# Patient Record
Sex: Female | Born: 1985
Health system: Southern US, Community
[De-identification: ages and names within clinical notes are randomized; demographics above are authoritative.]

## PROBLEM LIST (undated history)

## (undated) DIAGNOSIS — K5900 Constipation, unspecified: Secondary | ICD-10-CM

## (undated) DIAGNOSIS — K5909 Other constipation: Secondary | ICD-10-CM

## (undated) DIAGNOSIS — N946 Dysmenorrhea, unspecified: Secondary | ICD-10-CM

## (undated) DIAGNOSIS — Z973 Presence of spectacles and contact lenses: Secondary | ICD-10-CM

## (undated) DIAGNOSIS — K921 Melena: Secondary | ICD-10-CM

## (undated) HISTORY — DX: Constipation, unspecified: K59.00

## (undated) HISTORY — PX: NO PAST SURGERIES: SHX2092

## (undated) HISTORY — DX: Melena: K92.1

## (undated) HISTORY — PX: WISDOM TOOTH EXTRACTION: SHX21

---

## 2015-07-09 ENCOUNTER — Ambulatory Visit (INDEPENDENT_AMBULATORY_CARE_PROVIDER_SITE_OTHER): Payer: BLUE CROSS/BLUE SHIELD | Admitting: Obstetrics and Gynecology

## 2015-07-09 ENCOUNTER — Encounter: Payer: Self-pay | Admitting: Obstetrics and Gynecology

## 2015-07-09 VITALS — BP 98/58 | HR 68 | Resp 14 | Ht 62.0 in | Wt 112.0 lb

## 2015-07-09 DIAGNOSIS — Z3009 Encounter for other general counseling and advice on contraception: Secondary | ICD-10-CM

## 2015-07-09 DIAGNOSIS — Z124 Encounter for screening for malignant neoplasm of cervix: Secondary | ICD-10-CM | POA: Diagnosis not present

## 2015-07-09 DIAGNOSIS — N63 Unspecified lump in breast: Secondary | ICD-10-CM

## 2015-07-09 DIAGNOSIS — Z01419 Encounter for gynecological examination (general) (routine) without abnormal findings: Secondary | ICD-10-CM

## 2015-07-09 DIAGNOSIS — L293 Anogenital pruritus, unspecified: Secondary | ICD-10-CM | POA: Diagnosis not present

## 2015-07-09 DIAGNOSIS — N6312 Unspecified lump in the right breast, upper inner quadrant: Secondary | ICD-10-CM

## 2015-07-09 MED ORDER — NORGESTIMATE-ETH ESTRADIOL 0.25-35 MG-MCG PO TABS: 1.0000 | ORAL_TABLET | Freq: Every day | ORAL | Status: DC

## 2015-07-09 MED ORDER — BETAMETHASONE VALERATE 0.1 % EX OINT: TOPICAL_OINTMENT | CUTANEOUS | Status: DC

## 2015-07-09 NOTE — Progress Notes (Signed)
Patient is scheduled for Bilateral Breast Diagnostic Mammogram and R Breast Ultrasound at [facility] on 07/17/15 at 1450 . Patient agreeable to time/date/location. She is given CPT codes to call and request information regarding coverage of diagnostic imaging.

## 2015-07-09 NOTE — Progress Notes (Signed)
Patient ID: Mariah Brennan, female   DOB: 1986/07/12, 29 y.o.   MRN: [id number] 29 y.o. [id number] MarriedAfrican AmericanF here for annual exam.  On OCP's, normal cycles, can saturate a regular tampon in up to 2 hours. No BTB, cramps x 1 day, tolerable. Ibuprofen helps. Married, no dyspareunia. No plans for pregnancy in the next year. She c/o intermittent vulvar irritation, wondering if she can use something topically. Denies increased vaginal d/c.  Period Cycle (Days): 28 Period Duration (Days): 5 days  Period Pattern: Regular Menstrual Flow: Moderate Menstrual Control: Tampon, Maxi pad, Thin pad Dysmenorrhea Symptoms: Cramping  Patient's last menstrual period was 06/25/2015.          Sexually active: Yes.    The current method of family planning is OCP (estrogen/progesterone).    Exercising: No.  The patient does not participate in regular exercise at present. Smoker:  no  Health Maintenance: Pap:  2015 WNL per patient History of abnormal Pap:  no TDaP:  Unsure, thinks UTD she will check. Gardasil: no    reports that she has never smoked. She has never used smokeless tobacco. 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   History reviewed. No pertinent past medical history.  Past Surgical History  Procedure Laterality Date  . Wisdom tooth extraction      Current Outpatient Prescriptions  Medication Sig Dispense Refill  . betamethasone valerate ointment (VALISONE) 0.1 % Apply a pea sized amount BID x 1-2 weeks as needed 30 g 0  . norgestimate-ethinyl estradiol (SPRINTEC 28) 0.25-35 MG-MCG tablet Take 1 tablet by mouth daily. 3 Package 3   No current facility-administered medications for this visit.    Family History  Problem Relation Age of Onset  . Stroke Maternal Grandfather     Review of Systems  Constitutional: Negative.   HENT: Negative.   Eyes: Negative.   Respiratory: Negative.   Cardiovascular:  Negative.   Gastrointestinal: Negative.   Endocrine: Negative.   Genitourinary: Negative.   Musculoskeletal: Negative.   Skin: Negative.   Allergic/Immunologic: Negative.   Neurological: Negative.   Psychiatric/Behavioral: Negative.     Exam:   BP 98/58 mmHg  Pulse 68  Resp 14  Ht 5\' 2"  (1.575 m)  Wt 112 lb (50.803 kg)  BMI 20.48 kg/m2  LMP 06/25/2015  Weight change: @WEIGHTCHANGE @ Height:   Height: 5\' 2"  (157.5 cm)  Ht Readings from Last 3 Encounters:  07/09/15 5\' 2"  (1.575 m)    General appearance: alert, cooperative and appears stated age Head: Normocephalic, without obvious abnormality, atraumatic Neck: no adenopathy, supple, symmetrical, trachea midline and thyroid normal to inspection and palpation Lungs: clear to auscultation bilaterally Breasts: In the right breast at 2 o'clock, just outside the areolar region is an approximately 1 cm mobile lump, minimally tender. Left breast without lumps. Bilaterally no skin dimpling or skin changes.  Heart: regular rate and rhythm Abdomen: soft, non-tender; bowel sounds normal; no masses,  no organomegaly Extremities: extremities normal, atraumatic, no cyanosis or edema Skin: Skin color, texture, turgor normal. No rashes or lesions Lymph nodes: Cervical, supraclavicular, and axillary nodes normal. No abnormal inguinal nodes palpated Neurologic: Grossly normal   Pelvic: External genitalia:  The posterior fourchette is slightly erythematous/white with a few small fissures              Urethra:  normal appearing urethra with no masses, tenderness or lesions  Bartholins and Skenes: normal                 Vagina: normal appearing vagina with an increase of thick, creamy, white vaginal d/c.              Cervix: no lesions               Bimanual Exam:  Uterus:  normal size, contour, position, consistency, mobility, non-tender              Adnexa: no mass, fullness, tenderness               Rectovaginal: Confirms                Anus:  normal sphincter tone, no lesions  Chaperone was present for exam.  A:  Well Woman with normal exam  Breast lump, right breast at 2 o'clock  Contraception  Screening for cervical cancer  Genital pruritus, skin changes cw lichen simplex chronicus  P:   Pap with reflex hpv  Diagnostic breast imaging  Discussed TDAP  Discussed cholesterol screening  Breast self exams discussed  Continue OCP's  Wet prep probe  Treat vulva with steroid ointment x 1-2 weeks, then switch to Vaseline to use prn

## 2015-07-09 NOTE — Patient Instructions (Signed)

## 2015-07-10 LAB — IPS PAP TEST WITH REFLEX TO HPV

## 2015-07-10 LAB — WET PREP BY MOLECULAR PROBE
CANDIDA SPECIES: NEGATIVE
GARDNERELLA VAGINALIS: NEGATIVE
TRICHOMONAS VAG: NEGATIVE

## 2015-07-17 ENCOUNTER — Ambulatory Visit
Admission: RE | Admit: 2015-07-17 | Discharge: 2015-07-17 | Disposition: A | Discharge status: Home / Self Care | Payer: BLUE CROSS/BLUE SHIELD | Source: Ambulatory Visit | Attending: Obstetrics and Gynecology | Admitting: Obstetrics and Gynecology

## 2015-07-17 DIAGNOSIS — N6312 Unspecified lump in the right breast, upper inner quadrant: Secondary | ICD-10-CM

## 2015-07-24 ENCOUNTER — Telehealth: Payer: Self-pay | Admitting: Emergency Medicine

## 2015-07-24 NOTE — Telephone Encounter (Signed)
Call to patient and message from Dr. Talbert Nan given. Patient verbalized understanding and will follow up at appointment.  Routing to provider for final review. Patient agreeable to disposition. Will close encounter.

## 2015-07-24 NOTE — Telephone Encounter (Signed)
-----   Message from Salvadore Dom, MD sent at 07/17/2015  4:56 PM EST ----- The patient should do monthly breast self exams and f/u here for a breast exam in 1/17 (scheduled)

## 2015-08-27 ENCOUNTER — Ambulatory Visit: Payer: BLUE CROSS/BLUE SHIELD | Admitting: Obstetrics and Gynecology

## 2015-08-30 ENCOUNTER — Ambulatory Visit (INDEPENDENT_AMBULATORY_CARE_PROVIDER_SITE_OTHER): Payer: BLUE CROSS/BLUE SHIELD | Admitting: Obstetrics and Gynecology

## 2015-08-30 ENCOUNTER — Encounter: Payer: Self-pay | Admitting: Obstetrics and Gynecology

## 2015-08-30 VITALS — BP 96/56 | HR 80 | Resp 16 | Wt 111.0 lb

## 2015-08-30 DIAGNOSIS — N63 Unspecified lump in unspecified breast: Secondary | ICD-10-CM

## 2015-08-30 DIAGNOSIS — N644 Mastodynia: Secondary | ICD-10-CM | POA: Diagnosis not present

## 2015-08-30 NOTE — Progress Notes (Signed)
Patient ID: Mariah Brennan, female   DOB: [birth year], 30 y.o.   MRN: [id number] GYNECOLOGY  VISIT   HPI: 30 y.o.   Married  [place] American  female   [place] with Patient's last menstrual period was 08/20/2015.   here for right breast recheck. She was noted to have a right breast lump at her annual exam. Negative imaging. She c/o bilateral breast tenderness, no change. She drinks tea. The lump feels unchanged to her.   GYNECOLOGIC HISTORY: Patient's last menstrual period was 08/20/2015. Contraception:OCP Menopausal hormone therapy: None        OB History    Gravida Para Term Preterm AB TAB SAB Ectopic Multiple Living   0 0 0 0 0 0 0 0 0 0          There are no active problems to display for this patient.   History reviewed. No pertinent past medical history.  Past Surgical History  Procedure Laterality Date  . Wisdom tooth extraction      Current Outpatient Prescriptions  Medication Sig Dispense Refill  . norgestimate-ethinyl estradiol (SPRINTEC 28) 0.25-35 MG-MCG tablet Take 1 tablet by mouth daily. 3 Package 3   No current facility-administered medications for this visit.     ALLERGIES: Review of patient's allergies indicates no known allergies.  Family History  Problem Relation Age of Onset  . Stroke Maternal Grandfather     Social History   Social History  . Marital Status: Married    Spouse Name: N/A  . Number of Children: N/A  . Years of Education: N/A   Occupational History  . Not on file.   Social History Main Topics  . Smoking status: Never Smoker   . Smokeless tobacco: Never Used  . Alcohol Use: No  . Drug Use: No  . Sexual Activity:    Partners: Male   Other Topics Concern  . Not on file   Social History Narrative    Review of Systems  Constitutional: Negative.   HENT: Negative.   Eyes: Negative.   Respiratory: Negative.   Cardiovascular: Negative.   Gastrointestinal: Negative.   Genitourinary: Negative.   Musculoskeletal: Negative.    Skin: Negative.   Neurological: Negative.   Endo/Heme/Allergies: Negative.   Psychiatric/Behavioral: Negative.     PHYSICAL EXAMINATION:    BP 96/56 mmHg  Pulse 80  Resp 16  Wt 111 lb (50.349 kg)  LMP 08/20/2015    General appearance: alert, cooperative and appears stated age Breasts: in the right breast between 2-3 o'clock is an approximately 1 cm smooth, mobile lump. Unchanged from her prior exam No axillary or supraclavicular adenopathy   ASSESSMENT Breast lump, stable, negative imaging Mastalgia, bilateral    PLAN The patient will do monthly breast self exams after her cycle Recommended cutting back on caffeine  Call with any concerns or changes   An After Visit Summary was printed and given to the patient.

## 2015-08-30 NOTE — Patient Instructions (Signed)
Fibrocystic Breast Changes Fibrocystic breast changes occur when breast ducts become blocked, causing painful, fluid-filled lumps (cysts) to form in the breast. This is a common condition that is noncancerous (benign). It occurs when women go through hormonal changes during their menstrual cycle. Fibrocystic breast changes can affect one or both breasts. CAUSES  The exact cause of fibrocystic breast changes is not known, but it may be related to the female hormones estrogen and progesterone. Family traits that get passed from parent to child (genetics) may also be a factor in some cases. SIGNS AND SYMPTOMS   Tenderness, mild discomfort, or pain.   Swelling.   Rope-like feeling when touching the breast.   Lumpy breast, one or both sides.   Changes in breast size, especially before (larger) and after (smaller) the menstrual period.   Green or dark brown nipple discharge (not blood).  Symptoms are usually worse before menstrual periods start and get better toward the end of the menstrual period.  DIAGNOSIS  To make a diagnosis, your health care provider will ask you questions and perform a physical exam of your breasts. The health care provider may recommend other tests that can examine inside your breasts, such as:  A breast X-ray (mammogram).   Ultrasonography.  An MRI.  If something more than fibrocystic breast changes is suspected, your health care provider may take a breast tissue sample (breast biopsy) to examine. TREATMENT  Often, treatment is not needed. Your health care provider may recommend over-the-counter pain relievers to help lessen pain or discomfort caused by the fibrocystic breast changes. You may also be asked to change your diet to limit or stop eating foods or drinking beverages that contain caffeine. Foods and beverages that contain caffeine include chocolate, soda, coffee, and tea. Reducing sugar and fat in your diet may also help. Your health care provider  may also recommend:  Fine needle aspiration to remove fluid from a cyst that is causing pain.   Surgery to remove a large, persistent, and tender cyst. HOME CARE INSTRUCTIONS   Examine your breasts after every menstrual period. If you do not have menstrual periods, check your breasts the first day of every month. Feel for changes, such as more tenderness, a new growth, a change in breast size, or a change in a lump that has always been there.   Only take over-the-counter or prescription medicine as directed by your health care provider.   Wear a well-fitted support or sports bra, especially when exercising.   Decrease or avoid caffeine, fat, and sugar in your diet as directed by your health care provider.  SEEK MEDICAL CARE IF:   You have fluid leaking (discharge) from your nipples, especially bloody discharge.   You have new lumps or bumps in the breast.   Your breast or breasts become enlarged, red, and painful.   You have areas of your breast that pucker in.   Your nipples appear flat or indented.    This information is not intended to replace advice given to you by your health care provider. Make sure you discuss any questions you have with your health care provider.   Document Released: 05/21/2006 Document Revised: 04/25/2015 Document Reviewed: 01/23/2013 Elsevier Interactive Patient Education 2016 [employer]. Breast Tenderness Breast tenderness is a common problem for women of all ages. Breast tenderness may cause mild discomfort to severe pain. It has a variety of causes. Your health care provider will find out the likely cause of your breast tenderness by examining your breasts,  asking you about symptoms, and ordering some tests. Breast tenderness usually does not mean you have breast cancer. HOME CARE INSTRUCTIONS  Breast tenderness often can be handled at home. You can try:  Getting fitted for a new bra that provides more support, especially during  exercise.  Wearing a more supportive bra or sports bra while sleeping when your breasts are very tender.  If you have a breast injury, apply ice to the area:  Put ice in a plastic bag.  Place a towel between your skin and the bag.  Leave the ice on for 20 minutes, 2-3 times a day.  If your breasts are too full of milk as a result of breastfeeding, try:  Expressing milk either by hand or with a breast pump.  Applying a warm compress to the breasts for relief.  Taking over-the-counter pain relievers, if approved by your health care provider.  Taking other medicines that your health care provider prescribes. These may include antibiotic medicines or birth control pills. Over the long term, your breast tenderness might be eased if you:  Cut down on caffeine.  Reduce the amount of fat in your diet. Keep a log of the days and times when your breasts are most tender. This will help you and your health care provider find the cause of the tenderness and how to relieve it. Also, learn how to do breast exams at home. This will help you notice if you have an unusual growth or lump that could cause tenderness. SEEK MEDICAL CARE IF:   Any part of your breast is hard, red, and hot to the touch. This could be a sign of infection.  Fluid is coming out of your nipples (and you are not breastfeeding). Especially watch for blood or pus.  You have a fever as well as breast tenderness.  You have a new or painful lump in your breast that remains after your menstrual period ends.  You have tried to take care of the pain at home, but it has not gone away.  Your breast pain is getting worse, or the pain is making it hard to do the things you usually do during your day.   This information is not intended to replace advice given to you by your health care provider. Make sure you discuss any questions you have with your health care provider.   Document Released: 07/17/2008 Document Revised: 04/06/2013  Document Reviewed: 03/03/2013 Elsevier Interactive Patient Education [employer].

## 2015-09-17 ENCOUNTER — Telehealth: Payer: Self-pay | Admitting: Emergency Medicine

## 2015-09-17 NOTE — Telephone Encounter (Signed)
-----   Message from Salvadore Dom, MD sent at 09/14/2015  8:57 AM EST ----- Regarding: RE: Mammogram hold  Yes, thanks ----- Message -----    From: Michele Mcalpine, RN    Sent: 09/13/2015   5:05 PM      To: Salvadore Dom, MD Subject: Mammogram hold                                 Dr. Talbert Nan,  Patient in [facility] hold for R Breast Mass. Follow up breast check with you completed. Okay to remove from hold?

## 2016-05-18 ENCOUNTER — Other Ambulatory Visit: Payer: Self-pay | Admitting: Obstetrics and Gynecology

## 2016-05-19 NOTE — Telephone Encounter (Signed)
Will send 2 packs, she needs an annual exam in 11/17, please schedule

## 2016-05-19 NOTE — Telephone Encounter (Signed)
Medication refill request: Estarylla  Last AEX:  07/09/15 JJ Next AEX: Not scheduled  Last MMG (if hormonal medication request): 07/17/15 U/S R breast for pain, BIRADS1 Refill authorized: 07/09/15 #3 Packages 3R. Please advise. Thank you.

## 2016-05-19 NOTE — Telephone Encounter (Signed)
Left message for patient to call the office to schedule her AEX for more refills.

## 2016-06-25 ENCOUNTER — Telehealth: Payer: Self-pay | Admitting: Obstetrics and Gynecology

## 2016-06-25 NOTE — Telephone Encounter (Signed)
Patient called and states she is experiencing right breast pain.  She says she feels "something" on the inner side of the breast.

## 2016-06-25 NOTE — Telephone Encounter (Signed)
Spoke with patient. Patient states she has been having right breast pain for approximately 2 week. Patient states she initially thought it was related to something she was wearing. Patient reports pain has not gotten worse or better. Patient reports taking motrin x1 with little relief. Patient states she is unable to feel anything but reports her husband could. Reports redness, irritation, denies any drainage. Recommended OV for further evaluation. Patient scheduled with Dr. Talbert Nan 06/26/16 at 11:30am. Patient agreeable to date and time.   Routing to provider for final review. Patient is agreeable to disposition. Will close encounter.

## 2016-06-26 ENCOUNTER — Encounter: Payer: Self-pay | Admitting: Obstetrics and Gynecology

## 2016-06-26 ENCOUNTER — Ambulatory Visit (INDEPENDENT_AMBULATORY_CARE_PROVIDER_SITE_OTHER): Payer: BLUE CROSS/BLUE SHIELD | Admitting: Obstetrics and Gynecology

## 2016-06-26 VITALS — BP 100/60 | HR 68 | Resp 16 | Ht 62.0 in | Wt 115.0 lb

## 2016-06-26 DIAGNOSIS — N6019 Diffuse cystic mastopathy of unspecified breast: Secondary | ICD-10-CM

## 2016-06-26 DIAGNOSIS — N63 Unspecified lump in unspecified breast: Secondary | ICD-10-CM | POA: Diagnosis not present

## 2016-06-26 DIAGNOSIS — N644 Mastodynia: Secondary | ICD-10-CM

## 2016-06-26 NOTE — Patient Instructions (Signed)
PATIENT INSTRUCTIONS FIBROCYSTIC BREAST DISEASE  FOLLOW-UP:  Please make an appointment with your physician in one month.  Call your physician should you develop a new breast mass that is different, if one particular lump starts to enlarge, or if nipple discharge develops.   CAUSE:  Many women have some lumpiness within their breasts and these areas at times can become tender during certain times in your menstrual cycle.  These areas tend to feel like a firm rubber ball as compared to a cancer which will more commonly feel hard and almost rock-like.  Fibrocystic breast disease does not in and of itself increase your risk for breast cancer but you should be sure to examine yiour breasts at the same time of the month on a monthly basis.  If there are a lot of areas of lumpiness you should tape a piece of paper on the mirror with a diagram of your breasts, noting where the areas of lumpiness are and their relative size.  You can then refer to this diagram on a monthly basis to keep better track of any changes should they occur.  DIET:  You should try and avoid foods, or at least minimize foods, such as chocolate and caffeine which may cause the symptoms of tenderness to become worse.  ACTIVITY:  You may want to wear a bra that offers additional support, and/or consider a sports bra, especially during those times when your breasts are more tender.  MEDICATIONS:  Taking Vitamin E capsules twice a day along with Evening of Primrose Capsules three times a day, or as directed on the bottle, may help your symptoms.  These are both available over-the-counter and without a prescription. There is clinical evidence that these may help symptoms in some patients.   If your physician has prescribed medication for your fibrocystic breast disease, be sure to take it as instructed on the bottle and let him/her know if you have any side effects.  QUESTIONS:  Please feel free to call your physician  if you have any  questions, and they will be glad to assist you.

## 2016-06-26 NOTE — Progress Notes (Signed)
GYNECOLOGY  VISIT   HPI: 30 y.o.   Married  [place] American  female   [place] with Patient's last menstrual period was 06/11/2016.   here for  Right breast pain for 2 weeks  She was seen last December and noted to have a right breast lump also had breast pain, negative imaging. Her breast pain did resolve. She didn't feel a lump. She is now having pain in the right breast in the area of the previously felt lump. She doesn't feel anything, her husband thought he felt a lump. The pain is a constant dull ache, up to a 5/10 in severity. She doesn't drink caffeine.  GYNECOLOGIC HISTORY: Patient's last menstrual period was 06/11/2016. Contraception:None Menopausal hormone therapy: n/a        OB History    Gravida Para Term Preterm AB Living   0 0 0 0 0 0   SAB TAB Ectopic Multiple Live Births   0 0 0 0           There are no active problems to display for this patient.   No past medical history on file.  Past Surgical History:  Procedure Laterality Date  . WISDOM TOOTH EXTRACTION      No current outpatient prescriptions on file.   No current facility-administered medications for this visit.      ALLERGIES: Patient has no known allergies.  Family History  Problem Relation Age of Onset  . Stroke Maternal Grandfather     Social History   Social History  . Marital status: Married    Spouse name: N/A  . Number of children: N/A  . Years of education: N/A   Occupational History  . Not on file.   Social History Main Topics  . Smoking status: Never Smoker  . Smokeless tobacco: Never Used  . Alcohol use No  . Drug use: No  . Sexual activity: Yes    Partners: Male    Birth control/ protection: None   Other Topics Concern  . Not on file   Social History Narrative  . No narrative on file    Review of Systems  Constitutional: Negative.   HENT: Negative.   Eyes: Negative.   Respiratory: Negative.   Cardiovascular: Negative.   Gastrointestinal: Negative.    Genitourinary:       Right breast pain, lump on breast, redness near pain on breast   Musculoskeletal: Negative.   Skin: Negative.   Neurological: Negative.   Endo/Heme/Allergies: Negative.   Psychiatric/Behavioral: Negative.     PHYSICAL EXAMINATION:    BP 100/60 (BP Location: Right Arm, Patient Position: Sitting, Cuff Size: Normal)   Pulse 68   Resp 16   Ht 5\' 2"  (1.575 m)   Wt 115 lb (52.2 kg)   LMP 06/11/2016   BMI 21.03 kg/m     General appearance: alert, cooperative and appears stated age Breasts: In the right breast at 2-3 o'clock just outside the areolar region is a 1.5-2 cm smooth mobile lump (slightly larger than last year). Mildly tender. No skin changes. No axillary or supraclavicular adenopathy  ASSESSMENT Breast pain Tender lump right breast at 2-3 o'clock. This is the same lump that she had negative imaging for last year. Currently feels slightly larger, she is just past mid cycle    PLAN Stay off of caffeine Try evening primrose oil and vit E F/U in one month for her annual exam, at that visit will make a decision about possible imaging   An After  Visit Summary was printed and given to the patient.

## 2016-07-09 ENCOUNTER — Ambulatory Visit (INDEPENDENT_AMBULATORY_CARE_PROVIDER_SITE_OTHER): Payer: BLUE CROSS/BLUE SHIELD | Admitting: Obstetrics and Gynecology

## 2016-07-09 ENCOUNTER — Encounter: Payer: Self-pay | Admitting: Obstetrics and Gynecology

## 2016-07-09 VITALS — BP 100/56 | HR 88 | Resp 13 | Ht 62.0 in | Wt 111.0 lb

## 2016-07-09 DIAGNOSIS — Z01419 Encounter for gynecological examination (general) (routine) without abnormal findings: Secondary | ICD-10-CM | POA: Diagnosis not present

## 2016-07-09 NOTE — Patient Instructions (Signed)
EXERCISE AND DIET:  We recommended that you start or continue a regular exercise program for good health. Regular exercise means any activity that makes your heart beat faster and makes you sweat.  We recommend exercising at least 30 minutes per day at least 3 days a week, preferably 4 or 5.  We also recommend a diet low in fat and sugar.  Inactivity, poor dietary choices and obesity can cause diabetes, heart attack, stroke, and kidney damage, among others.    ALCOHOL AND SMOKING:  Women should limit their alcohol intake to no more than 7 drinks/beers/glasses of wine (combined, not each!) per week. Moderation of alcohol intake to this level decreases your risk of breast cancer and liver damage. And of course, no recreational drugs are part of a healthy lifestyle.  And absolutely no smoking or even second hand smoke. Most people know smoking can cause heart and lung diseases, but did you know it also contributes to weakening of your bones? Aging of your skin?  Yellowing of your teeth and nails?  CALCIUM AND VITAMIN D:  Adequate intake of calcium and Vitamin D are recommended.  The recommendations for exact amounts of these supplements seem to change often, but generally speaking 600 mg of calcium (either carbonate or citrate) and 800 units of Vitamin D per day seems prudent. Certain women may benefit from higher intake of Vitamin D.  If you are among these women, your doctor will have told you during your visit.    PAP SMEARS:  Pap smears, to check for cervical cancer or precancers,  have traditionally been done yearly, although recent scientific advances have shown that most women can have pap smears less often.  However, every woman still should have a physical exam from her gynecologist every year. It will include a breast check, inspection of the vulva and vagina to check for abnormal growths or skin changes, a visual exam of the cervix, and then an exam to evaluate the size and shape of the uterus and  ovaries.  And after 30 years of age, a rectal exam is indicated to check for rectal cancers. We will also provide age appropriate advice regarding health maintenance, like when you should have certain vaccines, screening for sexually transmitted diseases, bone density testing, colonoscopy, mammograms, etc.   MAMMOGRAMS:  All women over 30 years old should have a yearly mammogram. Many facilities now offer a "3D" mammogram, which may cost around $50 extra out of pocket. If possible,  we recommend you accept the option to have the 3D mammogram performed.  It both reduces the number of women who will be called back for extra views which then turn out to be normal, and it is better than the routine mammogram at detecting truly abnormal areas.    COLONOSCOPY:  Colonoscopy to screen for colon cancer is recommended for all women at age 50.  We know, you hate the idea of the prep.  We agree, BUT, having colon cancer and not knowing it is worse!!  Colon cancer so often starts as a polyp that can be seen and removed at colonscopy, which can quite literally save your life!  And if your first colonoscopy is normal and you have no family history of colon cancer, most women don't have to have it again for 10 years.  Once every ten years, you can do something that may end up saving your life, right?  We will be happy to help you get it scheduled when you are ready.    Be sure to check your insurance coverage so you understand how much it will cost.  It may be covered as a preventative service at no cost, but you should check your particular policy.      Breast Self-Awareness Introduction Breast self-awareness means:  Knowing how your breasts look.  Knowing how your breasts feel.  Checking your breasts every month for changes.  Telling your doctor if you notice a change in your breasts. Breast self-awareness allows you to notice a breast problem early while it is still small. How to do a breast self-exam One way  to learn what is normal for your breasts and to check for changes is to do a breast self-exam. To do a breast self-exam: Look for Changes  1. Take off all the clothes above your waist. 2. Stand in front of a mirror in a room with good lighting. 3. Put your hands on your hips. 4. Push your hands down. 5. Look at your breasts and nipples in the mirror to see if one breast or nipple looks different than the other. Check to see if:  The shape of one breast is different.  The size of one breast is different.  There are wrinkles, dips, and bumps in one breast and not the other. 6. Look at each breast for changes in your skin, such as:  Redness.  Scaly areas. 7. Look for changes in your nipples, such as:  Liquid around the nipples.  Bleeding.  Dimpling.  Redness.  A change in where the nipples are. Feel for Changes 1. Lie on your back on the floor. 2. Feel each breast. To do this, follow these steps:  Pick a breast to feel.  Put the arm closest to that breast above your head.  Use your other arm to feel the nipple area of your breast. Feel the area with the pads of your three middle fingers by making small circles with your fingers. For the first circle, press lightly. For the second circle, press harder. For the third circle, press even harder.  Keep making circles with your fingers at the light, harder, and even harder pressures as you move down your breast. Stop when you feel your ribs.  Move your fingers a little toward the center of your body.  Start making circles with your fingers again, this time going up until you reach your collarbone.  Keep making up and down circles until you reach your armpit. Remember to keep using the three pressures.  Feel the other breast in the same way. 3. Sit or stand in the shower or tub. 4. With soapy water on your skin, feel each breast the same way you did in step 2, when you were lying on the floor. Write Down What You Find  After  doing the self-exam, write down:  What is normal for each breast.  Any changes you find in each breast.  When you last had your period. How often should I check my breasts? Check your breasts every month. If you are breastfeeding, the best time to check them is after you feed your baby or after you use a breast pump. If you get periods, the best time to check your breasts is 5-7 days after your period is over. When should I see my doctor? See your doctor if you notice:  A change in shape or size of your breasts or nipples.  A change in the skin of your breast or nipples, such as red or scaly skin.  Unusual fluid coming from your nipples.  A lump or thick area that was not there before.  Pain in your breasts.  Anything that concerns you. This information is not intended to replace advice given to you by your health care provider. Make sure you discuss any questions you have with your health care provider. Document Released: 01/21/2008 Document Revised: 01/10/2016 Document Reviewed: 06/24/2015  2017 [employer]vier

## 2016-07-09 NOTE — Progress Notes (Signed)
30 y.o. G0P0000 MarriedCaucasianF here for annual exam. Not preventing pregnancy, not actively trying. Unprotected intercourse since 2/17.  Bleeds monthly x 3 days, then spotting for a couple of more days. Saturates a pad in 2 hours. No BTB. Sexually active, no pain. Not having frequent intercourse. Life is busy. Breast pain has improved as long as she takes evening primrose oil and vit e.  Period Cycle (Days): 28 Period Duration (Days): 7 days  Period Pattern: (!) Irregular Menstrual Flow: Moderate Menstrual Control: Maxi pad Dysmenorrhea: (!) Mild Dysmenorrhea Symptoms: Cramping  Patient's last menstrual period was 07/05/2016.          Sexually active: Yes.    The current method of family planning is none.    Exercising: No.  The patient does not participate in regular exercise at present. Smoker:  no  Health Maintenance: Pap:  07-09-15 WNL History of abnormal Pap:  no MMG:  Never Colonoscopy:  Never BMD:   Never TDaP:  unsure Gardasil: no    reports that she has never smoked. She has never used smokeless tobacco. 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 Husband works for [employer] in [employer].  No past medical history on file.  Past Surgical History:  Procedure Laterality Date  . WISDOM TOOTH EXTRACTION      No current outpatient prescriptions on file.   No current facility-administered medications for this visit.     Family History  Problem Relation Age of Onset  . Stroke Maternal Grandfather     Review of Systems  Constitutional: Negative.   HENT: Negative.   Eyes: Negative.   Respiratory: Negative.   Cardiovascular: Negative.   Gastrointestinal: Negative.   Endocrine: Negative.   Genitourinary: Negative.   Musculoskeletal: Negative.        Muscle swelling in hands  Skin: Negative.   Allergic/Immunologic: Negative.   Neurological: Negative.   Psychiatric/Behavioral: Negative.     Exam:   BP (!)  100/56 (BP Location: Right Arm, Patient Position: Sitting, Cuff Size: Normal)   Pulse 88   Resp 13   Ht 5\' 2"  (1.575 m)   Wt 111 lb (50.3 kg)   LMP 07/05/2016   BMI 20.30 kg/m   Weight change: @WEIGHTCHANGE @ Height:   Height: 5\' 2"  (157.5 cm)  Ht Readings from Last 3 Encounters:  07/09/16 5\' 2"  (1.575 m)  06/26/16 5\' 2"  (1.575 m)  07/09/15 5\' 2"  (1.575 m)    General appearance: alert, cooperative and appears stated age Head: Normocephalic, without obvious abnormality, atraumatic Neck: no adenopathy, supple, symmetrical, trachea midline and thyroid normal to inspection and palpation Lungs: clear to auscultation bilaterally Breasts: stable, flat, smooth, mobile lump in the right breast at 2 o'clock just outside the areolar region, 1 cm.  Heart: regular rate and rhythm Abdomen: soft, non-tender; bowel sounds normal; no masses,  no organomegaly Extremities: extremities normal, atraumatic, no cyanosis or edema Skin: Skin color, texture, turgor normal. No rashes or lesions Lymph nodes: Cervical, supraclavicular, and axillary nodes normal. No abnormal inguinal nodes palpated Neurologic: Grossly normal She c/o pain in her joints in her fingers and swelling, currently a little better, happened last year as well.   Pelvic: External genitalia:  no lesions              Urethra:  normal appearing urethra with no masses, tenderness or lesions              Bartholins and  Skenes: normal                 Vagina: normal appearing vagina with normal color and discharge, no lesions              Cervix: no lesions               Bimanual Exam:  Uterus:  normal size, contour, position, consistency, mobility, non-tender              Adnexa: no mass, fullness, tenderness               Rectovaginal: Confirms               Anus:  normal sphincter tone, no lesions  Chaperone was present for exam.  A:  Well Woman with normal exam  Stable right breast lump  P:   No pap this year  Declines screening  labs  Discussed breast self exam  Discussed calcium and vit D intake  Start PNV

## 2016-07-15 ENCOUNTER — Other Ambulatory Visit: Payer: Self-pay | Admitting: Obstetrics and Gynecology

## 2016-09-21 DIAGNOSIS — J029 Acute pharyngitis, unspecified: Secondary | ICD-10-CM | POA: Diagnosis not present

## 2017-05-04 DIAGNOSIS — Z23 Encounter for immunization: Secondary | ICD-10-CM | POA: Diagnosis not present

## 2017-07-17 DIAGNOSIS — S60012A Contusion of left thumb without damage to nail, initial encounter: Secondary | ICD-10-CM | POA: Diagnosis not present

## 2017-12-14 DIAGNOSIS — N899 Noninflammatory disorder of vagina, unspecified: Secondary | ICD-10-CM | POA: Diagnosis not present

## 2018-01-08 DIAGNOSIS — L989 Disorder of the skin and subcutaneous tissue, unspecified: Secondary | ICD-10-CM | POA: Diagnosis not present

## 2018-01-08 DIAGNOSIS — K121 Other forms of stomatitis: Secondary | ICD-10-CM | POA: Diagnosis not present

## 2018-02-19 NOTE — Progress Notes (Deleted)
32 y.o. G0P0000 MarriedAfrican AmericanF here for annual exam.      No LMP recorded.          Sexually active: {yes no:314532}  The current method of family planning is {contraception:315051}.    Exercising: {yes no:314532}  {types:19826} Smoker:  {YES [id number]  Health Maintenance: Pap:  07/09/2015 Negative History of abnormal Pap:  no TDaP:  *** Gardasil: ***   reports that she has never smoked. She has never used smokeless tobacco. She reports that she does not drink alcohol or use drugs.  No past medical history on file.  Past Surgical History:  Procedure Laterality Date  . WISDOM TOOTH EXTRACTION      No current outpatient medications on file.   No current facility-administered medications for this visit.     Family History  Problem Relation Age of Onset  . Stroke Maternal Grandfather     Review of Systems  Exam:   There were no vitals taken for this visit.  Weight change: @WEIGHTCHANGE @ Height:      Ht Readings from Last 3 Encounters:  07/09/16 5\' 2"  (1.575 m)  06/26/16 5\' 2"  (1.575 m)  07/09/15 5\' 2"  (1.575 m)    General appearance: alert, cooperative and appears stated age Head: Normocephalic, without obvious abnormality, atraumatic Neck: no adenopathy, supple, symmetrical, trachea midline and thyroid {CHL AMB PHY EX THYROID NORM DEFAULT:[phone]::"normal to inspection and palpation"} Lungs: clear to auscultation bilaterally Cardiovascular: regular rate and rhythm Breasts: {Exam; breast:13139::"normal appearance, no masses or tenderness"} Abdomen: soft, non-tender; non distended,  no masses,  no organomegaly Extremities: extremities normal, atraumatic, no cyanosis or edema Skin: Skin color, texture, turgor normal. No rashes or lesions Lymph nodes: Cervical, supraclavicular, and axillary nodes normal. No abnormal inguinal nodes palpated Neurologic: Grossly normal   Pelvic: External genitalia:  no lesions              Urethra:  normal appearing  urethra with no masses, tenderness or lesions              Bartholins and Skenes: normal                 Vagina: normal appearing vagina with normal color and discharge, no lesions              Cervix: {CHL AMB PHY EX CERVIX NORM DEFAULT:[phone]::"no lesions"}               Bimanual Exam:  Uterus:  {CHL AMB PHY EX UTERUS NORM DEFAULT:[id number]::"normal size, contour, position, consistency, mobility, non-tender"}              Adnexa: {CHL AMB PHY EX ADNEXA NO MASS DEFAULT:[phone]::"no mass, fullness, tenderness"}               Rectovaginal: Confirms               Anus:  normal sphincter tone, no lesions  Chaperone was present for exam.  A:  Well Woman with normal exam  P:

## 2018-02-22 ENCOUNTER — Ambulatory Visit: Payer: BLUE CROSS/BLUE SHIELD | Admitting: Obstetrics and Gynecology

## 2018-02-25 NOTE — Progress Notes (Signed)
32 y.o. G0P0000 MarriedAfrican AmericanF here for annual exam.   Prior to her cycle she develops menstrual cramps. The cramps start with ovulation and last for 2 weeks. This has been going on the last 3 months. The cramping is in her low mid abdomen, intermittent 3-4/10 in severity. Intermittently she also has diffuse abdominal cramps (feels different). She has changed her eating habits. BM mostly every day, occasionally skips. She doesn't feel her bowel movements are associated with the abdominal or pelvic pain. She c/o frequent urination intermittently. No urgency or dysuria.  Cycles have been every 28 days, over the last few months her cycles have been every 21-22 days.  Period Cycle (Days): 30 Period Duration (Days): 6 Period Pattern: Regular Menstrual Flow: Moderate Menstrual Control: Maxi pad Menstrual Control Change Freq (Hours): every 3 hours Dysmenorrhea: (!) Mild Dysmenorrhea Symptoms: Cramping, Nausea  Sexually active, slightly uncomfortable the last few times, felt like her vagina was uncomfortable, and some deep dyspareunia. She has self treated for yeast recently.  Not using contraception for the last 3 years   Patient's last menstrual period was 02/11/2018.          Sexually active: Yes.    The current method of family planning is none.    Exercising: No.  The patient does not participate in regular exercise at present. Smoker:  no  Health Maintenance: Pap:  07-09-15 negative  History of abnormal Pap:  no MMG:  never Colonoscopy:  never BMD:   never TDaP:  Unsure  Gardasil: no   reports that she has never smoked. She has never used smokeless tobacco. She reports that she does not drink alcohol or use drugs. 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   History reviewed. No pertinent past medical history.  Past Surgical History:  Procedure Laterality Date  . WISDOM TOOTH EXTRACTION      Current Outpatient Medications  Medication Sig Dispense  Refill  . Cyanocobalamin (B-12 PO) Take by mouth.    . folic acid (FOLVITE) 1 MG tablet Take 1 mg by mouth daily.    Marland Kitchen MAGNESIUM PO Take by mouth.     No current facility-administered medications for this visit.     Family History  Problem Relation Age of Onset  . Stroke Maternal Grandfather     Review of Systems  Constitutional: Negative.   HENT: Negative.   Eyes: Negative.   Respiratory: Negative.   Cardiovascular: Negative.   Gastrointestinal: Positive for abdominal pain and constipation.  Endocrine: Negative.   Genitourinary: Positive for dyspareunia and menstrual problem.  Musculoskeletal: Negative.   Skin: Negative.   Allergic/Immunologic: Negative.   Neurological: Negative.   Hematological: Negative.   Psychiatric/Behavioral: Negative.     Exam:   BP (!) 100/54 (BP Location: Right Arm, Patient Position: Sitting, Cuff Size: Normal)   Pulse 86   Resp 14   Ht 5\' 2"  (1.575 m)   Wt 117 lb 8 oz (53.3 kg)   LMP 02/11/2018   BMI 21.49 kg/m   Weight change: @WEIGHTCHANGE @ Height:   Height: 5\' 2"  (157.5 cm)  Ht Readings from Last 3 Encounters:  02/26/18 5\' 2"  (1.575 m)  07/09/16 5\' 2"  (1.575 m)  06/26/16 5\' 2"  (1.575 m)    General appearance: alert, cooperative and appears stated age Head: Normocephalic, without obvious abnormality, atraumatic Neck: no adenopathy, supple, symmetrical, trachea midline and thyroid normal to inspection and palpation Lungs: clear to auscultation bilaterally Cardiovascular: regular rate and rhythm Breasts: normal appearance, no  masses or tenderness Abdomen: soft, non-tender; non distended,  no masses,  no organomegaly Extremities: extremities normal, atraumatic, no cyanosis or edema Skin: Skin color, texture, turgor normal. No rashes or lesions Lymph nodes: Cervical, supraclavicular, and axillary nodes normal. No abnormal inguinal nodes palpated Neurologic: Grossly normal   Pelvic: External genitalia:  no lesions               Urethra:  normal appearing urethra with no masses, tenderness or lesions              Bartholins and Skenes: normal                 Vagina: normal appearing vagina with normal color and discharge, no lesions              Cervix: no cervical motion tenderness and no lesions               Bimanual Exam:  Uterus:  normal size, contour, position, consistency, mobility, non-tender and anteverted              Adnexa: tender with fullness in the right adnexa (suspect bowel), normal left adnexa               Rectovaginal: Confirms               Anus:  normal sphincter tone, no lesions  Chaperone was present for exam.  A:  Well Woman with normal exam  Recent cramping prior to her cycles  Abdominal pelvic pain  Dyspareunia  Infertility   Recent cycle changes (shorter)  Mild vulvovaginal irritation and dyspareunia  Urinary frequency  P:   Pap with hpv  Semen analysis (may not do secondary to religious belief)  Start BBT charts  Screening labs, TSH  Discussed Cycle day #3 FSH, estradiol  HSG (she may decline secondary to expense)  Discussed breast self exam  Discussed calcium and vit D intake  Affirm sent  Urine for ua, c&s

## 2018-02-26 ENCOUNTER — Other Ambulatory Visit (HOSPITAL_COMMUNITY)
Admission: RE | Admit: 2018-02-26 | Discharge: 2018-02-26 | Disposition: A | Discharge status: Home / Self Care | Payer: BLUE CROSS/BLUE SHIELD | Source: Ambulatory Visit | Attending: Obstetrics and Gynecology | Admitting: Obstetrics and Gynecology

## 2018-02-26 ENCOUNTER — Ambulatory Visit (INDEPENDENT_AMBULATORY_CARE_PROVIDER_SITE_OTHER): Payer: BLUE CROSS/BLUE SHIELD | Admitting: Obstetrics and Gynecology

## 2018-02-26 ENCOUNTER — Encounter: Payer: Self-pay | Admitting: Obstetrics and Gynecology

## 2018-02-26 ENCOUNTER — Other Ambulatory Visit: Payer: Self-pay

## 2018-02-26 VITALS — BP 100/54 | HR 86 | Resp 14 | Ht 62.0 in | Wt 117.5 lb

## 2018-02-26 DIAGNOSIS — N760 Acute vaginitis: Secondary | ICD-10-CM

## 2018-02-26 DIAGNOSIS — R102 Pelvic and perineal pain: Secondary | ICD-10-CM

## 2018-02-26 DIAGNOSIS — Z01419 Encounter for gynecological examination (general) (routine) without abnormal findings: Secondary | ICD-10-CM | POA: Diagnosis not present

## 2018-02-26 DIAGNOSIS — R350 Frequency of micturition: Secondary | ICD-10-CM

## 2018-02-26 DIAGNOSIS — Z124 Encounter for screening for malignant neoplasm of cervix: Secondary | ICD-10-CM | POA: Diagnosis not present

## 2018-02-26 DIAGNOSIS — N949 Unspecified condition associated with female genital organs and menstrual cycle: Secondary | ICD-10-CM | POA: Diagnosis not present

## 2018-02-26 DIAGNOSIS — N926 Irregular menstruation, unspecified: Secondary | ICD-10-CM | POA: Diagnosis not present

## 2018-02-26 DIAGNOSIS — R109 Unspecified abdominal pain: Secondary | ICD-10-CM

## 2018-02-26 DIAGNOSIS — Z3169 Encounter for other general counseling and advice on procreation: Secondary | ICD-10-CM

## 2018-02-26 DIAGNOSIS — Z0000 Encounter for general adult medical examination without abnormal findings: Secondary | ICD-10-CM | POA: Diagnosis not present

## 2018-02-26 NOTE — Patient Instructions (Signed)
EXERCISE AND DIET:  We recommended that you start or continue a regular exercise program for good health. Regular exercise means any activity that makes your heart beat faster and makes you sweat.  We recommend exercising at least 30 minutes per day at least 3 days a week, preferably 4 or 5.  We also recommend a diet low in fat and sugar.  Inactivity, poor dietary choices and obesity can cause diabetes, heart attack, stroke, and kidney damage, among others.    ALCOHOL AND SMOKING:  Women should limit their alcohol intake to no more than 7 drinks/beers/glasses of wine (combined, not each!) per week. Moderation of alcohol intake to this level decreases your risk of breast cancer and liver damage. And of course, no recreational drugs are part of a healthy lifestyle.  And absolutely no smoking or even second hand smoke. Most people know smoking can cause heart and lung diseases, but did you know it also contributes to weakening of your bones? Aging of your skin?  Yellowing of your teeth and nails?  CALCIUM AND VITAMIN D:  Adequate intake of calcium and Vitamin D are recommended.  The recommendations for exact amounts of these supplements seem to change often, but generally speaking 600 mg of calcium (either carbonate or citrate) and 800 units of Vitamin D per day seems prudent. Certain women may benefit from higher intake of Vitamin D.  If you are among these women, your doctor will have told you during your visit.    PAP SMEARS:  Pap smears, to check for cervical cancer or precancers,  have traditionally been done yearly, although recent scientific advances have shown that most women can have pap smears less often.  However, every woman still should have a physical exam from her gynecologist every year. It will include a breast check, inspection of the vulva and vagina to check for abnormal growths or skin changes, a visual exam of the cervix, and then an exam to evaluate the size and shape of the uterus and  ovaries.  And after 32 years of age, a rectal exam is indicated to check for rectal cancers. We will also provide age appropriate advice regarding health maintenance, like when you should have certain vaccines, screening for sexually transmitted diseases, bone density testing, colonoscopy, mammograms, etc.   MAMMOGRAMS:  All women over 40 years old should have a yearly mammogram. Many facilities now offer a "3D" mammogram, which may cost around $50 extra out of pocket. If possible,  we recommend you accept the option to have the 3D mammogram performed.  It both reduces the number of women who will be called back for extra views which then turn out to be normal, and it is better than the routine mammogram at detecting truly abnormal areas.    COLONOSCOPY:  Colonoscopy to screen for colon cancer is recommended for all women at age 50.  We know, you hate the idea of the prep.  We agree, BUT, having colon cancer and not knowing it is worse!!  Colon cancer so often starts as a polyp that can be seen and removed at colonscopy, which can quite literally save your life!  And if your first colonoscopy is normal and you have no family history of colon cancer, most women don't have to have it again for 10 years.  Once every ten years, you can do something that may end up saving your life, right?  We will be happy to help you get it scheduled when you are ready.    Be sure to check your insurance coverage so you understand how much it will cost.  It may be covered as a preventative service at no cost, but you should check your particular policy.     Hysterosalpingography Hysterosalpingography is a procedure to look inside your uterus and fallopian tubes. During this procedure, contrast dye is injected into your uterus through your vagina and cervix to illuminate your uterus while X-ray pictures are taken. This procedure may help your health care provider determine whether you have uterine tumors, adhesions, or structural  abnormalities. It is commonly used to help determine why a woman is unable to have children (infertility). The procedure usually lasts about 15-30 minutes. LET [facility] CARE PROVIDER KNOW ABOUT:  Any allergies you have.  All medicines you are taking, including vitamins, herbs, eye drops, creams, and over-the-counter medicines.  Previous problems you or members of your family have had with the use of anesthetics.  Any blood disorders you have.  Previous surgeries you have had.  Medical conditions you have. RISKS AND COMPLICATIONS Generally, this is a safe procedure. However, as with any procedure, problems can occur. Possible problems include:  Infection in the lining of the uterus (endometritis) or fallopian tubes (salpingitis).  Damage or perforation of the uterus or fallopian tubes.  An allergic reaction to the contrast dye used to perform the X-ray.  BEFORE THE PROCEDURE  Schedule the procedure after your period stops, but before your next ovulation. This is usually between day 5 and day 10 of your last period. Day 1 is the first day of your period.  Ask your health care provider about changing or stopping your regular medicines.  You may eat and drink as normal.  Empty your bladder before the procedure begins. PROCEDURE  You may be given a medicine to relax you (sedative) or an over-the-counter pain medicine to lessen any discomfort during the procedure.  You will lie down on an X-ray table with your feet in stirrups.  A device called a speculum will be placed into your vagina. This allows your health care provider to see inside your vagina to the cervix.  The cervix will be washed with a special soap.  A thin, flexible tube will be passed through the cervix into the uterus.  Contrast dye will be put into this tube.  Several X-rays will be taken as the contrast dye spreads through the uterus and fallopian tubes.  The tube will be taken out after the  procedure. What to expect after the procedure  Most of the contrast dye will flow out of your vagina naturally. You may want to wear a sanitary pad.  You may feel mild cramping and notice a little bleeding from your vagina. This should go away in 24 hours.  Ask when your test results will be ready. Make sure you get your test results. This information is not intended to replace advice given to you by your health care provider. Make sure you discuss any questions you have with your health care provider. Document Released: 09/06/2004 Document Revised: 01/10/2016 Document Reviewed: 02/04/2013 Elsevier Interactive Patient Education  2017 [employer] Breast self-awareness means being familiar with how your breasts look and feel. It involves checking your breasts regularly and reporting any changes to your health care provider. Practicing breast self-awareness is important. A change in your breasts can be a sign of a serious medical problem. Being familiar with how your breasts look and feel allows you to find any problems  early, when treatment is more likely to be successful. All women should practice breast self-awareness, including women who have had breast implants. How to do a breast self-exam One way to learn what is normal for your breasts and whether your breasts are changing is to do a breast self-exam. To do a breast self-exam: Look for Changes  1. Remove all the clothing above your waist. 2. Stand in front of a mirror in a room with good lighting. 3. Put your hands on your hips. 4. Push your hands firmly downward. 5. Compare your breasts in the mirror. Look for differences between them (asymmetry), such as: ? Differences in shape. ? Differences in size. ? Puckers, dips, and bumps in one breast and not the other. 6. Look at each breast for changes in your skin, such as: ? Redness. ? Scaly areas. 7. Look for changes in your nipples, such  as: ? Discharge. ? Bleeding. ? Dimpling. ? Redness. ? A change in position. Feel for Changes  Carefully feel your breasts for lumps and changes. It is best to do this while lying on your back on the floor and again while sitting or standing in the shower or tub with soapy water on your skin. Feel each breast in the following way:  Place the arm on the side of the breast you are examining above your head.  Feel your breast with the other hand.  Start in the nipple area and make  inch (2 cm) overlapping circles to feel your breast. Use the pads of your three middle fingers to do this. Apply light pressure, then medium pressure, then firm pressure. The light pressure will allow you to feel the tissue closest to the skin. The medium pressure will allow you to feel the tissue that is a little deeper. The firm pressure will allow you to feel the tissue close to the ribs.  Continue the overlapping circles, moving downward over the breast until you feel your ribs below your breast.  Move one finger-width toward the center of the body. Continue to use the  inch (2 cm) overlapping circles to feel your breast as you move slowly up toward your collarbone.  Continue the up and down exam using all three pressures until you reach your armpit.  Write Down What You Find  Write down what is normal for each breast and any changes that you find. Keep a written record with breast changes or normal findings for each breast. By writing this information down, you do not need to depend only on memory for size, tenderness, or location. Write down where you are in your menstrual cycle, if you are still menstruating. If you are having trouble noticing differences in your breasts, do not get discouraged. With time you will become more familiar with the variations in your breasts and more comfortable with the exam. How often should I examine my breasts? Examine your breasts every month. If you are breastfeeding, the  best time to examine your breasts is after a feeding or after using a breast pump. If you menstruate, the best time to examine your breasts is 5-7 days after your period is over. During your period, your breasts are lumpier, and it may be more difficult to notice changes. When should I see my health care provider? See your health care provider if you notice:  A change in shape or size of your breasts or nipples.  A change in the skin of your breast or nipples, such as a reddened  or scaly area.  Unusual discharge from your nipples.  A lump or thick area that was not there before.  Pain in your breasts.  Anything that concerns you.  This information is not intended to replace advice given to you by your health care provider. Make sure you discuss any questions you have with your health care provider. Document Released: 08/04/2005 Document Revised: 01/10/2016 Document Reviewed: 06/24/2015 Elsevier Interactive Patient Education  [employer].

## 2018-02-27 LAB — LIPID PANEL
CHOLESTEROL TOTAL: 130 mg/dL (ref 100–199)
Chol/HDL Ratio: 2.1 ratio (ref 0.0–4.4)
HDL: 61 mg/dL (ref >39)
LDL Calculated: 58 mg/dL (ref 0–99)
TRIGLYCERIDES: 57 mg/dL (ref 0–149)
VLDL CHOLESTEROL CAL: 11 mg/dL (ref 5–40)

## 2018-02-27 LAB — URINALYSIS, MICROSCOPIC ONLY
Bacteria, UA: NONE SEEN
Casts: NONE SEEN /lpf
RBC MICROSCOPIC, UA: NONE SEEN /HPF (ref 0–2)

## 2018-02-27 LAB — CBC
HEMATOCRIT: 35 % (ref 34.0–46.6)
HEMOGLOBIN: 11.5 g/dL (ref 11.1–15.9)
MCH: 29.7 pg (ref 26.6–33.0)
MCHC: 32.9 g/dL (ref 31.5–35.7)
MCV: 90 fL (ref 79–97)
PLATELETS: 213 10*3/uL (ref 150–450)
RBC: 3.87 x10E6/uL (ref 3.77–5.28)
RDW: 13.5 % (ref 12.3–15.4)
WBC: 4.8 10*3/uL (ref 3.4–10.8)

## 2018-02-27 LAB — COMPREHENSIVE METABOLIC PANEL
ALT: 7 IU/L (ref 0–32)
AST: 13 IU/L (ref 0–40)
Albumin/Globulin Ratio: 1.8 (ref 1.2–2.2)
Albumin: 4.3 g/dL (ref 3.5–5.5)
Alkaline Phosphatase: 79 IU/L (ref 39–117)
BILIRUBIN TOTAL: 0.7 mg/dL (ref 0.0–1.2)
BUN/Creatinine Ratio: 12 (ref 9–23)
BUN: 8 mg/dL (ref 6–20)
CHLORIDE: 102 mmol/L (ref 96–106)
CO2: 24 mmol/L (ref 20–29)
Calcium: 9.3 mg/dL (ref 8.7–10.2)
Creatinine, Ser: 0.66 mg/dL (ref 0.57–1.00)
GFR calc non Af Amer: 117 mL/min/{1.73_m2} (ref >59)
GFR, EST AFRICAN AMERICAN: 135 mL/min/{1.73_m2} (ref >59)
GLUCOSE: 79 mg/dL (ref 65–99)
Globulin, Total: 2.4 g/dL (ref 1.5–4.5)
Potassium: 3.9 mmol/L (ref 3.5–5.2)
Sodium: 139 mmol/L (ref 134–144)
Total Protein: 6.7 g/dL (ref 6.0–8.5)

## 2018-02-27 LAB — TSH: TSH: 1.7 u[IU]/mL (ref 0.450–4.500)

## 2018-03-01 ENCOUNTER — Telehealth: Payer: Self-pay | Admitting: Obstetrics and Gynecology

## 2018-03-01 LAB — URINE CULTURE

## 2018-03-01 LAB — CYTOLOGY - PAP
DIAGNOSIS: NEGATIVE
HPV: NOT DETECTED

## 2018-03-01 LAB — VAGINITIS/VAGINOSIS, DNA PROBE
Candida Species: NEGATIVE
Gardnerella vaginalis: NEGATIVE
Trichomonas vaginosis: NEGATIVE

## 2018-03-01 NOTE — Progress Notes (Signed)
GYNECOLOGY  VISIT   HPI: 32 y.o.   Married  [place] American  female   [place] with Patient's last menstrual period was 02/11/2018.   here for  Changes of menstrual cycle, cramping and pre-menstrual cramping. She is having abdominal/pelvic cramping for the 2 weeks prior to her cycles over the last few months. She is trying to get pregnant. Unprotected intercourse for several years. Recent cycle change from 28 to 21-22 days.   GYNECOLOGIC HISTORY: Patient's last menstrual period was 02/11/2018. Contraception none Menopausal hormone therapy no        OB History    Gravida  0   Para  0   Term  0   Preterm  0   AB  0   Living  0     SAB  0   TAB  0   Ectopic  0   Multiple  0   Live Births                 There are no active problems to display for this patient.   No past medical history on file.  Past Surgical History:  Procedure Laterality Date  . WISDOM TOOTH EXTRACTION      Current Outpatient Medications  Medication Sig Dispense Refill  . Cyanocobalamin (B-12 PO) Take by mouth.    . folic acid (FOLVITE) 1 MG tablet Take 1 mg by mouth daily.    Marland Kitchen MAGNESIUM PO Take by mouth.     No current facility-administered medications for this visit.      ALLERGIES: Patient has no known allergies.  Family History  Problem Relation Age of Onset  . Stroke Maternal Grandfather     Social History   Socioeconomic History  . Marital status: Married    Spouse name: Not on file  . Number of children: Not on file  . Years of education: Not on file  . Highest education level: Not on file  Occupational History  . Not on file  Social Needs  . Financial resource strain: Not on file  . Food insecurity:    Worry: Not on file    Inability: Not on file  . Transportation needs:    Medical: Not on file    Non-medical: Not on file  Tobacco Use  . Smoking status: Never Smoker  . Smokeless tobacco: Never Used  Substance and Sexual Activity  . Alcohol use: No     Alcohol/week: 0.0 oz  . Drug use: No  . Sexual activity: Yes    Partners: Male    Birth control/protection: None  Lifestyle  . Physical activity:    Days per week: Not on file    Minutes per session: Not on file  . Stress: Not on file  Relationships  . Social connections:    Talks on phone: Not on file    Gets together: Not on file    Attends religious service: Not on file    Active member of club or organization: Not on file    Attends meetings of clubs or organizations: Not on file    Relationship status: Not on file  . Intimate partner violence:    Fear of current or ex partner: Not on file    Emotionally abused: Not on file    Physically abused: Not on file    Forced sexual activity: Not on file  Other Topics Concern  . Not on file  Social History Narrative  . Not on file    Review  of Systems  Constitutional: Negative.   HENT: Negative.   Eyes: Negative.   Respiratory: Negative.   Cardiovascular: Negative.   Gastrointestinal: Negative.   Genitourinary: Positive for frequency.  Musculoskeletal: Negative.   Skin: Negative.   Neurological: Positive for headaches.  Endo/Heme/Allergies:       Menstrual cycle changes Breast pain  Psychiatric/Behavioral: Negative.     PHYSICAL EXAMINATION:    LMP 02/11/2018     General appearance: alert, cooperative and appears stated age  Ultrasound images reviewed with the patient  ASSESSMENT Premenstrual abdominal/pelvic pain. The pain can be in her mid abdomen. Only occasionally skipped cycles. I reviewed her menstrual calendar, the pain occurs for up to 2 weeks prior to her cycle, then ends just prior to or within a few days of her cycle starting. Normal ultrasound Infertility, unprotected intercourse for several years.  Recent cycle changes from 28 to 21-22 days Urinary frequency, recent urine culture likely contaminant with GBS     PLAN The pain is definitely cycle related, discussed that she could be having cycle  related GI pain (position of some of the pain is high for gyn pain) Offered referral to GI, she will consider We discussed suppressing ovulation with OCP's to see if that helps her pain, but she wants to get pregnant Will recheck urine culture Discussed that I don't have an explanation for the recent shortening of her cycles from 28 to 21-22 days, normal TSH, no increase in stress. Still WNL Discussed HSG and Semen analysis, she is still considering these options Given BBT charts at her last visit Discussed ovulation predictor kits  Could also check cycle day #3 [facility] and estradiol   An After Visit Summary was printed and given to the patient.  Over 15  minutes face to face time of which over 50% was spent in counseling.

## 2018-03-01 NOTE — Telephone Encounter (Signed)
Semen analysis order faxed to [facility].    Semen analysis kit placed up front for patient to pick up.    Routing to provider for final review. Patient is agreeable to disposition. Will close encounter.  Cc: Lerry Liner

## 2018-03-01 NOTE — Telephone Encounter (Signed)
Patient returned call. Addressed and answered additional benefit questions. Patient understood and is agreeable. No further questions, ok to close.

## 2018-03-01 NOTE — Telephone Encounter (Signed)
Spoke with patient. Patient requesting semen analysis kit. Advised of process, spouse name and DOB provided. Patient will pick up kit from [facility].   Patient request to schedule PUS. Scheduled for 7/16 at 3:00pm with consult to follow at 3:30pm with Dr. Talbert Nan.   Patient has additional questions regarding benefits, message to [facility] for return call, patient agreeable.   Written order to Dr. Talbert Nan for semen analysis.

## 2018-03-01 NOTE — Telephone Encounter (Signed)
Spoke with patient in regards to benefits for an ultrasound. Patient understood information presented. Patient was not aware of benefits and has concerns about proceeding to schedule. Patient has additional questions about the ultrasound, what it will be looking for and other precautions for the test.  Routing to Lamont Snowball, RN

## 2018-03-01 NOTE — Telephone Encounter (Signed)
Patient is ready to schedule ultrasound, but would like to know out of pocket before scheduling.

## 2018-03-01 NOTE — Telephone Encounter (Signed)
Returned call to patient to answer any additional benefit questions. Patient is scheduled for ultrasound appointment on 03/02/18 with Dr Talbert Nan. Left voicemail message requesting a return call

## 2018-03-01 NOTE — Telephone Encounter (Signed)
Left message to call Christol Thetford at [phone].  

## 2018-03-01 NOTE — Telephone Encounter (Signed)
Patient requesting a semen analysis kit.

## 2018-03-02 ENCOUNTER — Encounter: Payer: Self-pay | Admitting: Obstetrics and Gynecology

## 2018-03-02 ENCOUNTER — Ambulatory Visit (INDEPENDENT_AMBULATORY_CARE_PROVIDER_SITE_OTHER): Payer: BLUE CROSS/BLUE SHIELD

## 2018-03-02 ENCOUNTER — Ambulatory Visit (INDEPENDENT_AMBULATORY_CARE_PROVIDER_SITE_OTHER): Payer: BLUE CROSS/BLUE SHIELD | Admitting: Obstetrics and Gynecology

## 2018-03-02 VITALS — BP 102/68 | HR 68 | Ht 62.0 in | Wt 118.0 lb

## 2018-03-02 DIAGNOSIS — N949 Unspecified condition associated with female genital organs and menstrual cycle: Secondary | ICD-10-CM | POA: Diagnosis not present

## 2018-03-02 DIAGNOSIS — R109 Unspecified abdominal pain: Secondary | ICD-10-CM | POA: Diagnosis not present

## 2018-03-02 DIAGNOSIS — R102 Pelvic and perineal pain: Secondary | ICD-10-CM

## 2018-03-02 DIAGNOSIS — N926 Irregular menstruation, unspecified: Secondary | ICD-10-CM

## 2018-03-02 DIAGNOSIS — Z3169 Encounter for other general counseling and advice on procreation: Secondary | ICD-10-CM

## 2018-03-02 DIAGNOSIS — R350 Frequency of micturition: Secondary | ICD-10-CM

## 2018-03-04 LAB — URINE CULTURE

## 2018-03-05 ENCOUNTER — Telehealth: Payer: Self-pay

## 2018-03-05 NOTE — Telephone Encounter (Signed)
Notes recorded by Salvadore Dom, MD on 03/04/2018 at 4:03 PM EDT Please advise the patient of normal results.  Called patient and informed her that the results are normal.

## 2018-05-26 DIAGNOSIS — B373 Candidiasis of vulva and vagina: Secondary | ICD-10-CM | POA: Diagnosis not present

## 2018-10-12 DIAGNOSIS — Z20818 Contact with and (suspected) exposure to other bacterial communicable diseases: Secondary | ICD-10-CM | POA: Diagnosis not present

## 2018-10-14 DIAGNOSIS — J029 Acute pharyngitis, unspecified: Secondary | ICD-10-CM | POA: Insufficient documentation

## 2019-04-21 DIAGNOSIS — Z20828 Contact with and (suspected) exposure to other viral communicable diseases: Secondary | ICD-10-CM | POA: Diagnosis not present

## 2019-05-14 DIAGNOSIS — R070 Pain in throat: Secondary | ICD-10-CM | POA: Diagnosis not present

## 2019-05-14 DIAGNOSIS — Z20828 Contact with and (suspected) exposure to other viral communicable diseases: Secondary | ICD-10-CM | POA: Diagnosis not present

## 2019-06-02 DIAGNOSIS — H6121 Impacted cerumen, right ear: Secondary | ICD-10-CM | POA: Diagnosis not present

## 2019-06-02 DIAGNOSIS — H6503 Acute serous otitis media, bilateral: Secondary | ICD-10-CM | POA: Diagnosis not present

## 2019-06-23 DIAGNOSIS — Z713 Dietary counseling and surveillance: Secondary | ICD-10-CM | POA: Diagnosis not present

## 2019-09-07 ENCOUNTER — Other Ambulatory Visit: Payer: Self-pay

## 2019-09-07 ENCOUNTER — Ambulatory Visit (INDEPENDENT_AMBULATORY_CARE_PROVIDER_SITE_OTHER): Payer: BC Managed Care – PPO | Admitting: Internal Medicine

## 2019-09-07 ENCOUNTER — Encounter: Payer: Self-pay | Admitting: Internal Medicine

## 2019-09-07 VITALS — Ht 62.0 in | Wt 122.0 lb

## 2019-09-07 DIAGNOSIS — E559 Vitamin D deficiency, unspecified: Secondary | ICD-10-CM

## 2019-09-07 DIAGNOSIS — Z13220 Encounter for screening for lipoid disorders: Secondary | ICD-10-CM

## 2019-09-07 DIAGNOSIS — N762 Acute vulvitis: Secondary | ICD-10-CM | POA: Insufficient documentation

## 2019-09-07 DIAGNOSIS — B3731 Acute candidiasis of vulva and vagina: Secondary | ICD-10-CM | POA: Insufficient documentation

## 2019-09-07 DIAGNOSIS — Z0000 Encounter for general adult medical examination without abnormal findings: Secondary | ICD-10-CM

## 2019-09-07 DIAGNOSIS — N941 Unspecified dyspareunia: Secondary | ICD-10-CM | POA: Insufficient documentation

## 2019-09-07 DIAGNOSIS — K5900 Constipation, unspecified: Secondary | ICD-10-CM

## 2019-09-07 DIAGNOSIS — E611 Iron deficiency: Secondary | ICD-10-CM | POA: Diagnosis not present

## 2019-09-07 DIAGNOSIS — N979 Female infertility, unspecified: Secondary | ICD-10-CM

## 2019-09-07 DIAGNOSIS — B373 Candidiasis of vulva and vagina: Secondary | ICD-10-CM | POA: Insufficient documentation

## 2019-09-07 DIAGNOSIS — Z1329 Encounter for screening for other suspected endocrine disorder: Secondary | ICD-10-CM

## 2019-09-07 NOTE — Progress Notes (Signed)
Virtual Visit via Video Note  I connected with Mariah Brennan  on 09/07/19 at  1:40 PM EST by a video enabled telemedicine application and verified that I am speaking with the correct person using two identifiers.  Location patient: work [employer] or home office Persons participating in the virtual visit: patient, provider  I discussed the limitations of evaluation and management by telemedicine and the availability of in person appointments. The patient expressed understanding and agreed to proceed.   HPI: Constipation x > 2 weeks with hard stools tried to increase water, prunes fiber and has blood in stool from straining. This happened before and resolved own its own   Trying to conceive off ocp since 2017 no kids   ROS: See pertinent positives and negatives per HPI. General: wt stable  HEENT:nl hearing  CV: no cp Lungs: no sob GI+constipation  GU:?fertility issues  MSK: no pain  Neuro: no h/a  Psych: normal  Skin: no issues   Past Medical History:  Diagnosis Date  . Blood in stool   . Constipation     Past Surgical History:  Procedure Laterality Date  . WISDOM TOOTH EXTRACTION      Family History  Problem Relation Age of Onset  . Stroke Maternal Grandfather   . Diabetes Mother        prediabetes  . Diabetes Father        prediabetes    SOCIAL HX: married  AP   Current Outpatient Medications:  .  Cyanocobalamin (B-12 PO), Take by mouth., Disp: , Rfl:  .  folic acid (FOLVITE) 1 MG tablet, Take 1 mg by mouth daily., Disp: , Rfl:  .  MAGNESIUM PO, Take by mouth., Disp: , Rfl:  Takes B12 B6 Omega 3 gummy  Vitamin D   EXAM:  VITALS per patient if applicable:  GENERAL: alert, oriented, appears well and in no acute distress  HEENT: atraumatic, conjunttiva clear, no obvious abnormalities on inspection of external nose and ears  NECK: normal movements of the head and neck  LUNGS: on inspection no signs of respiratory distress, breathing rate  appears normal, no obvious gross SOB, gasping or wheezing  CV: no obvious cyanosis  MS: moves all visible extremities without noticeable abnormality  PSYCH/NEURO: pleasant and cooperative, no obvious depression or anxiety, speech and thought processing grossly intact  ASSESSMENT AND PLAN:  Discussed the following assessment and plan:  Constipation, unspecified constipation type -  Fasting labs  Disc miralax vs senna s vs colace  Consider linzess in future vs GI referral  fodmap diet and food diary Increased water and fiber  Female fertility problem/abdominal cramps with regular cycle x 9-10 days and fatigue  appt ob/gyn Dr. Garwin Brothers 11/15/19   HM Flu shot declines  Tdap 05/04/17  HPV declines  covid declines   Pap 02/26/18 neg neg HPV Dr. Terance Hart mammo age 9 y.o   -we discussed possible serious and likely etiologies, options for evaluation and workup, limitations of telemedicine visit vs in person visit, treatment, treatment risks and precautions. Pt prefers to treat via telemedicine empirically rather then risking or undertaking an in person visit at this moment. Patient agrees to seek prompt in person care if worsening, new symptoms arise, or if is not improving with treatment.   I discussed the assessment and treatment plan with the patient. The patient was provided an opportunity to ask questions and all were answered. The patient agreed with the plan and demonstrated an understanding of the instructions.  The patient was advised to call back or seek an in-person evaluation if the symptoms worsen or if the condition fails to improve as anticipated.  Time spent 30 minutes  Mariah Jackson, MD

## 2019-09-07 NOTE — Patient Instructions (Addendum)
Try Miralax otc laxative as needed  OR  Senna-S with Colace/Docusate as needed   Or Colace alone which is stool softner not a laxative   Align pre/probiotics may help as well  Keep a food diarrhea    Low-FODMAP Eating Plan  FODMAPs (fermentable oligosaccharides, disaccharides, monosaccharides, and polyols) are sugars that are hard for some people to digest. A low-FODMAP eating plan may help some people who have bowel (intestinal) diseases to manage their symptoms. This meal plan can be complicated to follow. Work with a diet and nutrition specialist (dietitian) to make a low-FODMAP eating plan that is right for you. A dietitian can make sure that you get enough nutrition from this diet. What are tips for following this plan? Reading food labels  Check labels for hidden FODMAPs such as: ? High-fructose syrup. ? Honey. ? Agave. ? Natural fruit flavors. ? Onion or garlic powder.  Choose low-FODMAP foods that contain 3-4 grams of fiber per serving.  Check food labels for serving sizes. Eat only one serving at a time to make sure FODMAP levels stay low. Meal planning  Follow a low-FODMAP eating plan for up to 6 weeks, or as told by your health care provider or dietitian.  To follow the eating plan: 1. Eliminate high-FODMAP foods from your diet completely. 2. Gradually reintroduce high-FODMAP foods into your diet one at a time. Most people should wait a few days after introducing one high-FODMAP food before they introduce the next high-FODMAP food. Your dietitian can recommend how quickly you may reintroduce foods. 3. Keep a daily record of what you eat and drink, and make note of any symptoms that you have after eating. 4. Review your daily record with a dietitian regularly. Your dietitian can help you identify which foods you can eat and which foods you should avoid. General tips  Drink enough fluid each day to keep your urine pale yellow.  Avoid processed foods. These often have  added sugar and may be high in FODMAPs.  Avoid most dairy products, whole grains, and sweeteners.  Work with a dietitian to make sure you get enough fiber in your diet. Recommended foods Grains  Gluten-free grains, such as rice, oats, buckwheat, quinoa, corn, polenta, and millet. Gluten-free pasta, bread, or cereal. Rice noodles. Corn tortillas. Vegetables  Eggplant, zucchini, cucumber, peppers, green beans, Brussels sprouts, bean sprouts, lettuce, arugula, kale, Swiss chard, spinach, collard greens, bok choy, summer squash, potato, and tomato. Limited amounts of corn, carrot, and sweet potato. Green parts of scallions. Fruits  Bananas, oranges, lemons, limes, blueberries, raspberries, strawberries, grapes, cantaloupe, honeydew melon, kiwi, papaya, passion fruit, and pineapple. Limited amounts of dried cranberries, banana chips, and shredded coconut. Dairy  Lactose-free milk, yogurt, and kefir. Lactose-free cottage cheese and ice cream. Non-dairy milks, such as almond, coconut, hemp, and rice milk. Yogurts made of non-dairy milks. Limited amounts of goat cheese, brie, mozzarella, parmesan, swiss, and other hard cheeses. Meats and other protein foods  Unseasoned beef, pork, poultry, or fish. Eggs. Berniece Salines. Tofu (firm) and tempeh. Limited amounts of nuts and seeds, such as almonds, walnuts, [place] nuts, pecans, peanuts, pumpkin seeds, chia seeds, and sunflower seeds. Fats and oils  Butter-free spreads. Vegetable oils, such as olive, canola, and sunflower oil. Seasoning and other foods  Artificial sweeteners with names that do not end in "ol" such as aspartame, saccharine, and stevia. Maple syrup, white table sugar, raw sugar, brown sugar, and molasses. Fresh basil, coriander, parsley, rosemary, and thyme. Beverages  Water and mineral water.  Sugar-sweetened soft drinks. Small amounts of orange juice or cranberry juice. Black and green tea. Most dry wines. Coffee. This may not be a complete  list of low-FODMAP foods. Talk with your dietitian for more information. Foods to avoid Grains  Wheat, including kamut, durum, and semolina. Barley and bulgur. Couscous. Wheat-based cereals. Wheat noodles, bread, crackers, and pastries. Vegetables  Chicory root, artichoke, asparagus, cabbage, snow peas, sugar snap peas, mushrooms, and cauliflower. Onions, garlic, leeks, and the white part of scallions. Fruits  Fresh, dried, and juiced forms of apple, pear, watermelon, peach, plum, cherries, apricots, blackberries, boysenberries, figs, nectarines, and mango. Avocado. Dairy  Milk, yogurt, ice cream, and soft cheese. Cream and sour cream. Milk-based sauces. Custard. Meats and other protein foods  Fried or fatty meat. Sausage. Cashews and pistachios. Soybeans, baked beans, black beans, chickpeas, kidney beans, fava beans, navy beans, lentils, and split peas. Seasoning and other foods  Any sugar-free gum or candy. Foods that contain artificial sweeteners such as sorbitol, mannitol, isomalt, or xylitol. Foods that contain honey, high-fructose corn syrup, or agave. Bouillon, vegetable stock, beef stock, and chicken stock. Garlic and onion powder. Condiments made with onion, such as hummus, chutney, pickles, relish, salad dressing, and salsa. Tomato paste. Beverages  Chicory-based drinks. Coffee substitutes. Chamomile tea. Fennel tea. Sweet or fortified wines such as port or sherry. Diet soft drinks made with isomalt, mannitol, maltitol, sorbitol, or xylitol. Apple, pear, and mango juice. Juices with high-fructose corn syrup. This may not be a complete list of high-FODMAP foods. Talk with your dietitian to discuss what dietary choices are best for you.  Summary  A low-FODMAP eating plan is a short-term diet that eliminates FODMAPs from your diet to help ease symptoms of certain bowel diseases.  The eating plan usually lasts up to 6 weeks. After that, high-FODMAP foods are restarted gradually, one  at a time, so you can find out which may be causing symptoms.  A low-FODMAP eating plan can be complicated. It is best to work with a dietitian who has experience with this type of plan. This information is not intended to replace advice given to you by your health care provider. Make sure you discuss any questions you have with your health care provider. Document Revised: 07/17/2017 Document Reviewed: 03/31/2017 Elsevier Patient Education  [place].  Constipation, Adult Constipation is when a person has fewer bowel movements in a week than normal, has difficulty having a bowel movement, or has stools that are dry, hard, or larger than normal. Constipation may be caused by an underlying condition. It may become worse with age if a person takes certain medicines and does not take in enough fluids. Follow these instructions at home: Eating and drinking   Eat foods that have a lot of fiber, such as fresh fruits and vegetables, whole grains, and beans.  Limit foods that are high in fat, low in fiber, or overly processed, such as french fries, hamburgers, cookies, candies, and soda.  Drink enough fluid to keep your urine clear or pale yellow. General instructions  Exercise regularly or as told by your health care provider.  Go to the restroom when you have the urge to go. Do not hold it in.  Take over-the-counter and prescription medicines only as told by your health care provider. These include any fiber supplements.  Practice pelvic floor retraining exercises, such as deep breathing while relaxing the lower abdomen and pelvic floor relaxation during bowel movements.  Watch your condition for any changes.  Keep all follow-up visits as told by your health care provider. This is important. Contact a health care provider if:  You have pain that gets worse.  You have a fever.  You do not have a bowel movement after 4 days.  You vomit.  You are not hungry.  You lose  weight.  You are bleeding from the anus.  You have thin, pencil-like stools. Get help right away if:  You have a fever and your symptoms suddenly get worse.  You leak stool or have blood in your stool.  Your abdomen is bloated.  You have severe pain in your abdomen.  You feel dizzy or you faint. This information is not intended to replace advice given to you by your health care provider. Make sure you discuss any questions you have with your health care provider. Document Revised: 07/17/2017 Document Reviewed: 01/23/2016 Elsevier Patient Education  [place] Fissure, Adult  An anal fissure is a small tear or crack in the tissue of the anus. Bleeding from a fissure usually stops on its own within a few minutes. However, bleeding will often occur again with each bowel movement until the fissure heals. What are the causes? This condition is usually caused by passing a large or hard stool (feces). Other causes include:  Constipation.  Frequent diarrhea.  Inflammatory bowel disease (Crohn's disease or ulcerative colitis).  Childbirth.  Infections.  Anal sex. What are the signs or symptoms? Symptoms of this condition include:  Bleeding from the rectum.  Small amounts of blood seen on your stool, on the toilet paper, or in the toilet after a bowel movement. The blood coats the outside of the stool and is not mixed with the stool.  Painful bowel movements.  Itching or irritation around the anus. How is this diagnosed? A health care provider may diagnose this condition by closely examining the anal area. An anal fissure can usually be seen with careful inspection. In some cases, a rectal exam may be performed, or a short tube (anoscope) may be used to examine the anal canal. How is this treated? Initial treatment for this condition may include:  Taking steps to avoid constipation. This may include making changes to your diet, such as increasing your intake  of fiber or fluid.  Taking fiber supplements. These supplements can soften your stool to help make bowel movements easier. Your health care provider may also prescribe a stool softener if your stool is hard.  Taking sitz baths. This may help to heal the tear.  Using medicated creams or ointments. These may be prescribed to lessen discomfort. Treatments that are sometimes used if initial treatments do not work well or if the condition is more severe may include:  Botulinum injection.  Surgery to repair the fissure. Follow these instructions at home: Eating and drinking   Avoid foods that may cause constipation, such as bananas, milk, and other dairy products.  Eat all fruits, except bananas.  Drink enough fluid to keep your urine pale yellow.  Eat foods that are high in fiber, such as beans, whole grains, and fresh fruits and vegetables. General instructions   Take over-the-counter and prescription medicines only as told by your health care provider.  Use creams or ointments only as told by your health care provider.  Keep the anal area clean and dry.  Take sitz baths as told by your health care provider. Do not use soap in the sitz baths.  Keep all follow-up visits as  told by your health care provider. This is important. Contact a health care provider if you have:  More bleeding.  A fever.  Diarrhea that is mixed with blood.  Pain that continues.  Ongoing problems that are getting worse rather than better. Summary  An anal fissure is a small tear or crack in the tissue of the anus. This condition is usually caused by passing a large or hard stool (feces). Other causes include constipation and frequent diarrhea.  Initial treatment for this condition may include taking steps to avoid constipation, such as increasing your intake of fiber or fluid.  Follow instructions for care as told by your health care provider.  Contact your health care provider if you have more  bleeding or your problem is getting worse rather than better.  Keep all follow-up visits as told by your health care provider. This is important. This information is not intended to replace advice given to you by your health care provider. Make sure you discuss any questions you have with your health care provider. Document Revised: 01/14/2018 Document Reviewed: 01/14/2018 Elsevier Patient Education  [place].

## 2019-09-08 ENCOUNTER — Ambulatory Visit: Payer: Self-pay | Admitting: Internal Medicine

## 2019-11-08 ENCOUNTER — Encounter: Payer: Self-pay | Admitting: Internal Medicine

## 2019-11-08 ENCOUNTER — Other Ambulatory Visit: Payer: Self-pay

## 2019-11-08 ENCOUNTER — Ambulatory Visit (INDEPENDENT_AMBULATORY_CARE_PROVIDER_SITE_OTHER): Payer: BC Managed Care – PPO

## 2019-11-08 ENCOUNTER — Ambulatory Visit: Payer: BC Managed Care – PPO | Admitting: Internal Medicine

## 2019-11-08 VITALS — BP 118/72 | HR 75 | Temp 97.8°F | Ht 62.0 in | Wt 125.4 lb

## 2019-11-08 DIAGNOSIS — L709 Acne, unspecified: Secondary | ICD-10-CM

## 2019-11-08 DIAGNOSIS — R1030 Lower abdominal pain, unspecified: Secondary | ICD-10-CM

## 2019-11-08 DIAGNOSIS — N3000 Acute cystitis without hematuria: Secondary | ICD-10-CM

## 2019-11-08 DIAGNOSIS — K5900 Constipation, unspecified: Secondary | ICD-10-CM | POA: Diagnosis not present

## 2019-11-08 DIAGNOSIS — R109 Unspecified abdominal pain: Secondary | ICD-10-CM | POA: Diagnosis not present

## 2019-11-08 DIAGNOSIS — D219 Benign neoplasm of connective and other soft tissue, unspecified: Secondary | ICD-10-CM

## 2019-11-08 LAB — POCT URINE PREGNANCY: Preg Test, Ur: NEGATIVE

## 2019-11-08 NOTE — Progress Notes (Signed)
Chief Complaint  Patient presents with  . Urinary Tract Infection    dull chramping in the abdominal/bladder area. frequent urination. low apetite. Onset of over a week ago   F/u  1. Lower mid abdomen ab pain 6/10 with dull ache/cramps >1 week with freq urination and low appetite just had menses 12 days ago tried ibuprofen at night and increased water and cranberry juice for pain 2 years ago had [place] +small fibroids pending appt ob/gyn Dr.Cousins 11/15/19  Denies vaginal discharge or odor Denies burning with urination   2. Cyst acne jawline thinking about derm it was worse before cycle now after cycle tried tea tree takes longer to go away and causing dark marks  3. Constipation better per pt    Review of Systems  Constitutional: Negative for weight loss.  Respiratory: Negative for shortness of breath.   Cardiovascular: Negative for chest pain.  Gastrointestinal: Positive for abdominal pain.   Past Medical History:  Diagnosis Date  . Blood in stool   . Constipation    Past Surgical History:  Procedure Laterality Date  . WISDOM TOOTH EXTRACTION     Family History  Problem Relation Age of Onset  . Stroke Maternal Grandfather   . Diabetes Mother        prediabetes  . Diabetes Father        prediabetes   Social History   Socioeconomic History  . Marital status: Married    Spouse name: Not on file  . Number of children: Not on file  . Years of education: Not on file  . Highest education level: Not on file  Occupational History  . Not on file  Tobacco Use  . Smoking status: Never Smoker  . Smokeless tobacco: Never Used  Substance and Sexual Activity  . Alcohol use: No    Alcohol/week: 0.0 standard drinks  . Drug use: No  . Sexual activity: Yes    Partners: Male    Birth control/protection: None  Other Topics Concern  . 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, sew, craft, photos    Social Determinants of Health   Financial Resource Strain:   . Difficulty of Paying Living Expenses:   Food Insecurity:   . Worried About Charity fundraiser in the Last Year:   . Arboriculturist in the Last Year:   Transportation Needs:   . Film/video editor (Medical):   Marland Kitchen Lack of Transportation (Non-Medical):   Physical Activity:   . Days of Exercise per Week:   . Minutes of Exercise per Session:   Stress:   . Feeling of Stress :   Social Connections:   . Frequency of Communication with Friends and Family:   . Frequency of Social Gatherings with Friends and Family:   . Attends Religious Services:   . Active Member of Clubs or Organizations:   . Attends Archivist Meetings:   Marland Kitchen Marital Status:   Intimate Partner Violence:   . Fear of Current or Ex-Partner:   . Emotionally Abused:   Marland Kitchen Physically Abused:   . Sexually Abused:    No outpatient medications have been marked as taking for the 11/08/19 encounter (  Office Visit) with McLean-Scocuzza, Nino Glow, MD.   No Known Allergies Recent Results (from the past 2160 hour(s))  POCT urine pregnancy     Status: Normal   Collection Time: 11/08/19  2:15 PM  Result Value Ref Range   Preg Test, Ur Negative Negative   Objective  Body mass index is 22.94 kg/m. Wt Readings from Last 3 Encounters:  11/08/19 125 lb 6.4 oz (56.9 kg)  09/07/19 122 lb (55.3 kg)  03/02/18 118 lb (53.5 kg)   Temp Readings from Last 3 Encounters:  11/08/19 97.8 F (36.6 C) (Temporal)   BP Readings from Last 3 Encounters:  11/08/19 118/72  03/02/18 102/68  02/26/18 (!) 100/54   Pulse Readings from Last 3 Encounters:  11/08/19 75  03/02/18 68  02/26/18 86    Physical Exam Vitals and nursing note reviewed.  Constitutional:      Appearance: Normal appearance. She is well-developed and well-groomed.  HENT:      Head: Normocephalic and atraumatic.  Eyes:     Conjunctiva/sclera: Conjunctivae normal.     Pupils: Pupils are equal, round, and reactive to light.  Cardiovascular:     Rate and Rhythm: Normal rate and regular rhythm.     Heart sounds: Normal heart sounds. No murmur.  Pulmonary:     Effort: Pulmonary effort is normal.     Breath sounds: Normal breath sounds.  Abdominal:     General: Abdomen is flat. Bowel sounds are normal.     Tenderness: There is abdominal tenderness in the suprapubic area. There is no guarding or rebound.     Comments: Mild to mod ttp    Skin:    General: Skin is warm and dry.  Neurological:     General: No focal deficit present.     Mental Status: She is alert and oriented to person, place, and time. Mental status is at baseline.     Gait: Gait normal.  Psychiatric:        Attention and Perception: Attention and perception normal.        Mood and Affect: Mood and affect normal.        Speech: Speech normal.        Behavior: Behavior normal. Behavior is cooperative.        Thought Content: Thought content normal.        Cognition and Memory: Cognition and memory normal.        Judgment: Judgment normal.     Assessment  Plan  Acute cystitis without hematuria - Plan: Urinalysis, Routine w reflex microscopic, Urine Culture  Lower abdominal pain - Plan: POCT urine pregnancy, DG Abd 1 View r/o constipation  With h/o fibroids asking Dr. Garwin Brothers to do TVUS and pelvic US to work this up also she has fertility issues as well and this will need further w/u  If gyn w/u negative consider ab imaging US/CT   Constipation, unspecified constipation type Ab 1 view w/u today  R/o cause of ab pain   Acne, unspecified acne type Disc otc txs Panoxyl 4-8% wash face at night  Differen gel at night with moisturizer  Urban skin Rx 3:1 cleanser  Ambi-hyperpigmentation  Clindamycin prescription if you want this let me know  cetaphil antibacterial/acne wash  cerave  Tea  tree  Let me know when ready for dermatology referral in [place]  HM sch fasting labs [facility]  HM Flu shot declines  Tdap 05/04/17  HPV declines  covid declines   Pap 02/26/18 neg  neg HPV Dr. Terance Hart mammo age 34 y.o  ob/gyn Dr. Garwin Brothers Provider: Dr. Olivia Mackie McLean-Scocuzza-Internal Medicine

## 2019-11-08 NOTE — Patient Instructions (Addendum)
Panoxyl 4-8% wash face at night  Differen gel at night with moisturizer  Urban skin Rx 3:1 cleanser  Ambi-hyperpigmentation  Clindamycin prescription if you want this let me know  cetaphil antibacterial/acne wash  cerave  Tea tree     Abdominal Pain, Adult Pain in the abdomen (abdominal pain) can be caused by many things. Often, abdominal pain is not serious and it gets better with no treatment or by being treated at home. However, sometimes abdominal pain is serious. Your health care provider will ask questions about your medical history and do a physical exam to try to determine the cause of your abdominal pain. Follow these instructions at home:  Medicines  Take over-the-counter and prescription medicines only as told by your health care provider.  Do not take a laxative unless told by your health care provider. General instructions  Watch your condition for any changes.  Drink enough fluid to keep your urine pale yellow.  Keep all follow-up visits as told by your health care provider. This is important. Contact a health care provider if:  Your abdominal pain changes or gets worse.  You are not hungry or you lose weight without trying.  You are constipated or have diarrhea for more than 2-3 days.  You have pain when you urinate or have a bowel movement.  Your abdominal pain wakes you up at night.  Your pain gets worse with meals, after eating, or with certain foods.  You are vomiting and cannot keep anything down.  You have a fever.  You have blood in your urine. Get help right away if:  Your pain does not go away as soon as your health care provider told you to expect.  You cannot stop vomiting.  Your pain is only in areas of the abdomen, such as the right side or the left lower portion of the abdomen. Pain on the right side could be caused by appendicitis.  You have bloody or black stools, or stools that look like tar.  You have severe pain, cramping, or  bloating in your abdomen.  You have signs of dehydration, such as: ? Dark urine, very little urine, or no urine. ? Cracked lips. ? Dry mouth. ? Sunken eyes. ? Sleepiness. ? Weakness.  You have trouble breathing or chest pain. Summary  Often, abdominal pain is not serious and it gets better with no treatment or by being treated at home. However, sometimes abdominal pain is serious.  Watch your condition for any changes.  Take over-the-counter and prescription medicines only as told by your health care provider.  Contact a health care provider if your abdominal pain changes or gets worse.  Get help right away if you have severe pain, cramping, or bloating in your abdomen. This information is not intended to replace advice given to you by your health care provider. Make sure you discuss any questions you have with your health care provider. Document Revised: 12/13/2018 Document Reviewed: 12/13/2018 Elsevier Patient Education  2020 [place].  Acne  Acne is a skin problem that causes pimples and other skin changes. The skin has many tiny openings called pores. Each pore contains an oil gland. Oil glands make an oily substance that is called sebum. Acne occurs when the pores in the skin get blocked. The pores may become infected with bacteria, or they may become red, sore, and swollen. Acne is a common skin problem, especially for teenagers. It often occurs on the face, neck, chest, upper arms, and back. Acne  usually goes away over time. What are the causes? Acne is caused when oil glands get blocked with sebum, dead skin cells, and dirt. The bacteria that are normally found in the oil glands then multiply and cause inflammation. Acne is commonly triggered by changes in your hormones. These hormonal changes can cause the oil glands to get bigger and to make more sebum. Factors that can make acne worse include:  Hormone changes during: ? Adolescence. ? Women's menstrual  cycles. ? Pregnancy.  Oil-based cosmetics and hair products.  Stress.  Hormone problems that are caused by certain diseases.  Certain medicines.  Pressure from headbands, backpacks, or shoulder pads.  Exposure to certain oils and chemicals.  Eating a diet high in carbohydrates that quickly turn to sugar. These include dairy products, desserts, and chocolates. What increases the risk? This condition is more likely to develop in:  Teenagers.  People who have a family history of acne. What are the signs or symptoms? Symptoms include:  Small, red bumps (pimples or papules).  Whiteheads.  Blackheads.  Small, pus-filled pimples (pustules).  Big, red pimples or pustules that feel tender. More severe acne can cause:  An abscess. This is an infected area that contains a collection of pus.  Cysts. These are hard, painful, fluid-filled sacs.  Scars. These can happen after large pimples heal. How is this diagnosed? This condition is diagnosed with a medical history and physical exam. Blood tests may also be done. How is this treated? Treatment for this condition can vary depending on the severity of your acne. Treatment may include:  Creams and lotions that prevent oil glands from clogging.  Creams and lotions that treat or prevent infections and inflammation.  Antibiotic medicines that are applied to the skin or taken as a pill.  Pills that decrease sebum production.  Birth control pills.  Light or laser treatments.  Injections of medicine into the affected areas.  Chemicals that cause peeling of the skin.  Surgery. Your health care provider will also recommend the best way to take care of your skin. Good skin care is the most important part of treatment. Follow these instructions at home: Skin care Take care of your skin as told by your health care provider. You may be told to do these things:  Wash your skin gently at least two times each day, as well  as: ? After you exercise. ? Before you go to bed.  Use mild soap.  Apply a water-based skin moisturizer after you wash your skin.  Use a sunscreen or sunblock with SPF 30 or greater. This is especially important if you are using acne medicines.  Choose cosmetics that will not block your oil glands (are noncomedogenic). Medicines  Take over-the-counter and prescription medicines only as told by your health care provider.  If you were prescribed an antibiotic medicine, apply it or take it as told by your health care provider. Do not stop using the antibiotic even if your condition improves. General instructions  Keep your hair clean and off your face. If you have oily hair, shampoo your hair regularly or daily.  Avoid wearing tight headbands or hats.  Avoid picking or squeezing your pimples. That can make your acne worse and cause scarring.  Shave gently and only when necessary.  Keep a food journal to figure out if any foods are linked to your acne. Avoid dairy products, desserts, and chocolates.  Take steps to manage and reduce stress.  Keep all follow-up visits as told  by your health care provider. This is important. Contact a health care provider if:  Your acne is not better after eight weeks.  Your acne gets worse.  You have a large area of skin that is red or tender.  You think that you are having side effects from any acne medicine. Summary  Acne is a skin problem that causes pimples and other skin changes. Acne is a common skin problem, especially for teenagers. Acne usually goes away over time.  Acne is commonly triggered by changes in your hormones. There are many other causes, such as stress, diet, and certain medicines.  Follow your health care provider's instructions for how to take care of your skin. Good skin care is the most important part of treatment.  Take over-the-counter and prescription medicines only as told by your health care provider.  Contact  your health care provider if you think that you are having side effects from any acne medicine. This information is not intended to replace advice given to you by your health care provider. Make sure you discuss any questions you have with your health care provider. Document Revised: 12/15/2017 Document Reviewed: 12/15/2017 Elsevier Patient Education  [place].

## 2019-11-09 DIAGNOSIS — E559 Vitamin D deficiency, unspecified: Secondary | ICD-10-CM | POA: Diagnosis not present

## 2019-11-09 DIAGNOSIS — Z13220 Encounter for screening for lipoid disorders: Secondary | ICD-10-CM | POA: Diagnosis not present

## 2019-11-09 DIAGNOSIS — Z1329 Encounter for screening for other suspected endocrine disorder: Secondary | ICD-10-CM | POA: Diagnosis not present

## 2019-11-09 DIAGNOSIS — Z0000 Encounter for general adult medical examination without abnormal findings: Secondary | ICD-10-CM | POA: Diagnosis not present

## 2019-11-09 DIAGNOSIS — E611 Iron deficiency: Secondary | ICD-10-CM | POA: Diagnosis not present

## 2019-11-09 DIAGNOSIS — K5900 Constipation, unspecified: Secondary | ICD-10-CM | POA: Diagnosis not present

## 2019-11-09 LAB — URINALYSIS, ROUTINE W REFLEX MICROSCOPIC
Bilirubin Urine: NEGATIVE
Glucose, UA: NEGATIVE
Hgb urine dipstick: NEGATIVE
Ketones, ur: NEGATIVE
Leukocytes,Ua: NEGATIVE
Nitrite: NEGATIVE
Protein, ur: NEGATIVE
Specific Gravity, Urine: 1.007 (ref 1.001–1.03)
pH: 7.5 (ref 5.0–8.0)

## 2019-11-09 LAB — URINE CULTURE
MICRO NUMBER:: 10282413
SPECIMEN QUALITY:: ADEQUATE

## 2019-11-10 LAB — COMPREHENSIVE METABOLIC PANEL
ALT: 10 IU/L (ref 0–32)
AST: 14 IU/L (ref 0–40)
Albumin/Globulin Ratio: 1.9 (ref 1.2–2.2)
Albumin: 4.6 g/dL (ref 3.8–4.8)
Alkaline Phosphatase: 73 IU/L (ref 39–117)
BUN/Creatinine Ratio: 15 (ref 9–23)
BUN: 12 mg/dL (ref 6–20)
Bilirubin Total: 0.6 mg/dL (ref 0.0–1.2)
CO2: 23 mmol/L (ref 20–29)
Calcium: 9.7 mg/dL (ref 8.7–10.2)
Chloride: 104 mmol/L (ref 96–106)
Creatinine, Ser: 0.78 mg/dL (ref 0.57–1.00)
GFR calc Af Amer: 116 mL/min/{1.73_m2} (ref >59)
GFR calc non Af Amer: 100 mL/min/{1.73_m2} (ref >59)
Globulin, Total: 2.4 g/dL (ref 1.5–4.5)
Glucose: 82 mg/dL (ref 65–99)
Potassium: 4.5 mmol/L (ref 3.5–5.2)
Sodium: 139 mmol/L (ref 134–144)
Total Protein: 7 g/dL (ref 6.0–8.5)

## 2019-11-10 LAB — CBC WITH DIFFERENTIAL/PLATELET
Basophils Absolute: 0 10*3/uL (ref 0.0–0.2)
Basos: 1 %
EOS (ABSOLUTE): 0.1 10*3/uL (ref 0.0–0.4)
Eos: 2 %
Hematocrit: 36.1 % (ref 34.0–46.6)
Hemoglobin: 12.2 g/dL (ref 11.1–15.9)
Immature Grans (Abs): 0 10*3/uL (ref 0.0–0.1)
Immature Granulocytes: 0 %
Lymphocytes Absolute: 2.1 10*3/uL (ref 0.7–3.1)
Lymphs: 48 %
MCH: 30 pg (ref 26.6–33.0)
MCHC: 33.8 g/dL (ref 31.5–35.7)
MCV: 89 fL (ref 79–97)
Monocytes Absolute: 0.4 10*3/uL (ref 0.1–0.9)
Monocytes: 9 %
Neutrophils Absolute: 1.7 10*3/uL (ref 1.4–7.0)
Neutrophils: 40 %
Platelets: 252 10*3/uL (ref 150–450)
RBC: 4.07 x10E6/uL (ref 3.77–5.28)
RDW: 12.3 % (ref 11.7–15.4)
WBC: 4.3 10*3/uL (ref 3.4–10.8)

## 2019-11-10 LAB — IRON,TIBC AND FERRITIN PANEL
Ferritin: 34 ng/mL (ref 15–150)
Iron Saturation: 17 % (ref 15–55)
Iron: 54 ug/dL (ref 27–159)
Total Iron Binding Capacity: 326 ug/dL (ref 250–450)
UIBC: 272 ug/dL (ref 131–425)

## 2019-11-10 LAB — T4, FREE: Free T4: 1.21 ng/dL (ref 0.82–1.77)

## 2019-11-10 LAB — LIPID PANEL
Chol/HDL Ratio: 2.3 ratio (ref 0.0–4.4)
Cholesterol, Total: 153 mg/dL (ref 100–199)
HDL: 67 mg/dL (ref >39)
LDL Chol Calc (NIH): 77 mg/dL (ref 0–99)
Triglycerides: 36 mg/dL (ref 0–149)
VLDL Cholesterol Cal: 9 mg/dL (ref 5–40)

## 2019-11-10 LAB — VITAMIN D 25 HYDROXY (VIT D DEFICIENCY, FRACTURES): Vit D, 25-Hydroxy: 32.9 ng/mL (ref 30.0–100.0)

## 2019-11-10 LAB — TSH: TSH: 1.15 u[IU]/mL (ref 0.450–4.500)

## 2019-11-15 DIAGNOSIS — N970 Female infertility associated with anovulation: Secondary | ICD-10-CM | POA: Diagnosis not present

## 2019-11-15 DIAGNOSIS — Z6822 Body mass index (BMI) 22.0-22.9, adult: Secondary | ICD-10-CM | POA: Diagnosis not present

## 2019-11-15 DIAGNOSIS — R102 Pelvic and perineal pain: Secondary | ICD-10-CM | POA: Diagnosis not present

## 2019-11-15 DIAGNOSIS — Z1151 Encounter for screening for human papillomavirus (HPV): Secondary | ICD-10-CM | POA: Diagnosis not present

## 2019-11-15 DIAGNOSIS — Z01419 Encounter for gynecological examination (general) (routine) without abnormal findings: Secondary | ICD-10-CM | POA: Diagnosis not present

## 2019-12-02 DIAGNOSIS — N979 Female infertility, unspecified: Secondary | ICD-10-CM | POA: Diagnosis not present

## 2019-12-02 DIAGNOSIS — R102 Pelvic and perineal pain: Secondary | ICD-10-CM | POA: Diagnosis not present

## 2019-12-07 ENCOUNTER — Encounter: Payer: Self-pay | Admitting: Internal Medicine

## 2019-12-07 ENCOUNTER — Ambulatory Visit: Payer: BC Managed Care – PPO | Admitting: Internal Medicine

## 2019-12-07 ENCOUNTER — Other Ambulatory Visit: Payer: Self-pay

## 2019-12-07 VITALS — BP 86/66 | HR 72 | Temp 98.2°F | Ht 62.0 in | Wt 125.0 lb

## 2019-12-07 DIAGNOSIS — D219 Benign neoplasm of connective and other soft tissue, unspecified: Secondary | ICD-10-CM | POA: Diagnosis not present

## 2019-12-07 DIAGNOSIS — R109 Unspecified abdominal pain: Secondary | ICD-10-CM

## 2019-12-07 DIAGNOSIS — Z0000 Encounter for general adult medical examination without abnormal findings: Secondary | ICD-10-CM | POA: Insufficient documentation

## 2019-12-07 DIAGNOSIS — R102 Pelvic and perineal pain: Secondary | ICD-10-CM

## 2019-12-07 NOTE — Progress Notes (Signed)
Chief Complaint  Patient presents with  . Follow-up   Annual  1. Abdominal pain  Still having pelvic pain and Dr. Benjie Karvonen ordered pelvic PT does not think endometriosis or surgical need to work up for now and [place] negative per pt except for small fibroids. Pelvic pain feels like intermittent cramps. Dr. Garwin Brothers ob/gyn tried flexeril w/o help and made her feel weird and have abnormal dreams  She also has intermittent constipation and declines to see GI for now, further imaging  Review of Systems  Constitutional: Negative for weight loss.  HENT: Negative for hearing loss.   Eyes: Negative for blurred vision.  Respiratory: Negative for shortness of breath.   Cardiovascular: Negative for chest pain.  Gastrointestinal: Negative for abdominal pain.  Musculoskeletal: Negative for falls.  Skin: Negative for rash.  Neurological: Negative for headaches.  Psychiatric/Behavioral: Negative for depression.   Past Medical History:  Diagnosis Date  . Blood in stool   . Constipation    Past Surgical History:  Procedure Laterality Date  . WISDOM TOOTH EXTRACTION     Family History  Problem Relation Age of Onset  . Stroke Maternal Grandfather   . Diabetes Mother        prediabetes  . Diabetes Father        prediabetes   Social History   Socioeconomic History  . Marital status: Married    Spouse name: Not on file  . Number of children: Not on file  . Years of education: Not on file  . Highest education level: Not on file  Occupational History  . Not on file  Tobacco Use  . Smoking status: Never Smoker  . Smokeless tobacco: Never Used  Substance and Sexual Activity  . Alcohol use: No    Alcohol/week: 0.0 standard drinks  . Drug use: No  . Sexual activity: Yes    Partners: Male    Birth control/protection: None  Other Topics Concern  . Not on file  Social History Narrative   [employer]    No kids    Married DPR Husband Caitlynd Labrosse [phone]   Went  to [employer] in [place]    Moved back to [place] in 2016    From [place] to read, [employer], sew, craft, photos    Social Determinants of Health   Financial Resource Strain:   . Difficulty of Paying Living Expenses:   Food Insecurity:   . Worried About Charity fundraiser in the Last Year:   . Arboriculturist in the Last Year:   Transportation Needs:   . Film/video editor (Medical):   Marland Kitchen Lack of Transportation (Non-Medical):   Physical Activity:   . Days of Exercise per Week:   . Minutes of Exercise per Session:   Stress:   . Feeling of Stress :   Social Connections:   . Frequency of Communication with Friends and Family:   . Frequency of Social Gatherings with Friends and Family:   . Attends Religious Services:   . Active Member of Clubs or Organizations:   . Attends Archivist Meetings:   Marland Kitchen Marital Status:   Intimate Partner Violence:   . Fear of Current or Ex-Partner:   . Emotionally Abused:   Marland Kitchen Physically Abused:   . Sexually Abused:    Current Meds  Medication Sig  . Cyanocobalamin (B-12 PO) Take by mouth.  . folic acid (FOLVITE) 1 MG tablet Take 1  mg by mouth daily.   Allergies  Allergen Reactions  . Flexeril [Cyclobenzaprine]     Abnormal dreams/felt weird    Recent Results (from the past 2160 hour(s))  Urinalysis, Routine w reflex microscopic     Status: None   Collection Time: 11/08/19  1:59 PM  Result Value Ref Range   Color, Urine YELLOW YELLOW   APPearance CLEAR CLEAR   Specific Gravity, Urine 1.007 1.001 - 1.03   pH 7.5 5.0 - 8.0   Glucose, UA NEGATIVE NEGATIVE   Bilirubin Urine NEGATIVE NEGATIVE   Ketones, ur NEGATIVE NEGATIVE   Hgb urine dipstick NEGATIVE NEGATIVE   Protein, ur NEGATIVE NEGATIVE   Nitrite NEGATIVE NEGATIVE   Leukocytes,Ua NEGATIVE NEGATIVE  Urine Culture     Status: None   Collection Time: 11/08/19  1:59 PM   Specimen: Urine  Result Value Ref Range   MICRO NUMBER: [id number]    SPECIMEN QUALITY: Adequate     Sample Source NOT GIVEN    STATUS: FINAL    ISOLATE 1:      Growth of mixed flora was isolated, suggesting probable contamination. No further testing will be performed. If clinically indicated, recollection using a method to minimize contamination, with prompt transfer to Urine Culture Transport Tube, is  recommended.   POCT urine pregnancy     Status: Normal   Collection Time: 11/08/19  2:15 PM  Result Value Ref Range   Preg Test, Ur Negative Negative  Lipid panel     Status: None   Collection Time: 11/09/19  7:22 AM  Result Value Ref Range   Cholesterol, Total 153 100 - 199 mg/dL   Triglycerides 36 0 - 149 mg/dL   HDL 67 >39 mg/dL   VLDL Cholesterol Cal 9 5 - 40 mg/dL   LDL Chol Calc (NIH) 77 0 - 99 mg/dL   Chol/HDL Ratio 2.3 0.0 - 4.4 ratio    Comment:                                   T. Chol/HDL Ratio                                             Men  Women                               1/2 Avg.Risk  3.4    3.3                                   Avg.Risk  5.0    4.4                                2X Avg.Risk  9.6    7.1                                3X Avg.Risk 23.4   11.0   CBC with Differential/Platelet     Status: None   Collection Time: 11/09/19  7:22 AM  Result Value Ref Range   WBC 4.3 3.4 - 10.8  x10E3/uL   RBC 4.07 3.77 - 5.28 x10E6/uL   Hemoglobin 12.2 11.1 - 15.9 g/dL   Hematocrit 36.1 34.0 - 46.6 %   MCV 89 79 - 97 fL   MCH 30.0 26.6 - 33.0 pg   MCHC 33.8 31.5 - 35.7 g/dL   RDW 12.3 11.7 - 15.4 %   Platelets 252 150 - 450 x10E3/uL   Neutrophils 40 Not Estab. %   Lymphs 48 Not Estab. %   Monocytes 9 Not Estab. %   Eos 2 Not Estab. %   Basos 1 Not Estab. %   Neutrophils Absolute 1.7 1.4 - 7.0 x10E3/uL   Lymphocytes Absolute 2.1 0.7 - 3.1 x10E3/uL   Monocytes Absolute 0.4 0.1 - 0.9 x10E3/uL   EOS (ABSOLUTE) 0.1 0.0 - 0.4 x10E3/uL   Basophils Absolute 0.0 0.0 - 0.2 x10E3/uL   Immature Granulocytes 0 Not Estab. %   Immature Grans (Abs) 0.0 0.0 - 0.1  x10E3/uL  Comprehensive metabolic panel     Status: None   Collection Time: 11/09/19  7:22 AM  Result Value Ref Range   Glucose 82 65 - 99 mg/dL   BUN 12 6 - 20 mg/dL   Creatinine, Ser 0.78 0.57 - 1.00 mg/dL   GFR calc non Af Amer 100 >59 mL/min/1.73   GFR calc Af Amer 116 >59 mL/min/1.73   BUN/Creatinine Ratio 15 9 - 23   Sodium 139 134 - 144 mmol/L   Potassium 4.5 3.5 - 5.2 mmol/L   Chloride 104 96 - 106 mmol/L   CO2 23 20 - 29 mmol/L   Calcium 9.7 8.7 - 10.2 mg/dL   Total Protein 7.0 6.0 - 8.5 g/dL   Albumin 4.6 3.8 - 4.8 g/dL   Globulin, Total 2.4 1.5 - 4.5 g/dL   Albumin/Globulin Ratio 1.9 1.2 - 2.2   Bilirubin Total 0.6 0.0 - 1.2 mg/dL   Alkaline Phosphatase 73 39 - 117 IU/L   AST 14 0 - 40 IU/L   ALT 10 0 - 32 IU/L  TSH     Status: None   Collection Time: 11/09/19  7:22 AM  Result Value Ref Range   TSH 1.150 0.450 - 4.500 uIU/mL  T4, free     Status: None   Collection Time: 11/09/19  7:22 AM  Result Value Ref Range   Free T4 1.21 0.82 - 1.77 ng/dL  Vitamin D (25 hydroxy)     Status: None   Collection Time: 11/09/19  7:22 AM  Result Value Ref Range   Vit D, 25-Hydroxy 32.9 30.0 - 100.0 ng/mL    Comment: Vitamin D deficiency has been defined by the Institute of Medicine and an Endocrine Society practice guideline as a level of serum 25-OH vitamin D less than 20 ng/mL (1,2). The Endocrine Society went on to further define vitamin D insufficiency as a level between 21 and 29 ng/mL (2). 1. IOM (Institute of Medicine). 2010. Dietary reference    intakes for calcium and D. [place]: The    [employer]. 2. Holick MF, Binkley Bagley, Bischoff-Ferrari HA, et al.    Evaluation, treatment, and prevention of vitamin D    deficiency: an Endocrine Society clinical practice    guideline. JCEM. 2011 Jul; 96(7):1911-30.   Iron, TIBC and Ferritin Panel     Status: None   Collection Time: 11/09/19  7:22 AM  Result Value Ref Range   Total Iron Binding Capacity 326  250 - 450 ug/dL   UIBC 272 131 -  425 ug/dL   Iron 54 27 - 159 ug/dL   Iron Saturation 17 15 - 55 %   Ferritin 34 15 - 150 ng/mL   Objective  Body mass index is 22.86 kg/m. Wt Readings from Last 3 Encounters:  12/07/19 125 lb (56.7 kg)  11/08/19 125 lb 6.4 oz (56.9 kg)  09/07/19 122 lb (55.3 kg)   Temp Readings from Last 3 Encounters:  12/07/19 98.2 F (36.8 C) (Temporal)  11/08/19 97.8 F (36.6 C) (Temporal)   BP Readings from Last 3 Encounters:  12/07/19 (!) 86/66  11/08/19 118/72  03/02/18 102/68   Pulse Readings from Last 3 Encounters:  12/07/19 72  11/08/19 75  03/02/18 68    Physical Exam Vitals and nursing note reviewed.  Constitutional:      Appearance: Normal appearance. She is well-developed and well-groomed.  HENT:     Head: Normocephalic and atraumatic.  Eyes:     Conjunctiva/sclera: Conjunctivae normal.     Pupils: Pupils are equal, round, and reactive to light.  Cardiovascular:     Rate and Rhythm: Normal rate and regular rhythm.     Heart sounds: Normal heart sounds. No murmur.  Pulmonary:     Effort: Pulmonary effort is normal.     Breath sounds: Normal breath sounds.  Abdominal:     General: Abdomen is flat. Bowel sounds are normal.     Tenderness: There is no abdominal tenderness.  Skin:    General: Skin is warm and dry.  Neurological:     General: No focal deficit present.     Mental Status: She is alert and oriented to person, place, and time. Mental status is at baseline.     Gait: Gait normal.  Psychiatric:        Attention and Perception: Attention and perception normal.        Mood and Affect: Mood and affect normal.        Speech: Speech normal.        Behavior: Behavior normal. Behavior is cooperative.        Thought Content: Thought content normal.        Cognition and Memory: Cognition and memory normal.        Judgment: Judgment normal.     Assessment  Plan  Annual physical exam Flu shot declines  Tdap 05/04/17  HPV  declines  covid declines but considering  Pap 02/26/18 neg neg HPV Dr. Terance Hart mammo age 42 y.o ob/gyn Dr. Terri Piedra. Briant Sites ob/gyn rec healthy diet and exercise   Fibroids Pelvic pain Abdominal cramps ? GU vs GI related I.e ibs with constipation pt to let me know if wants further w/u  F/u ob/gyn  Consider further imaging I.e CT scan vs ob/gyn  Provider: Dr. Olivia Mackie McLean-Scocuzza-Internal Medicine

## 2019-12-07 NOTE — Patient Instructions (Addendum)
Pfizer vaccine x 2 doses   COVID-19 Vaccine Information can be found at: [email or web address] For questions related to vaccine distribution or appointments, please email [email or web address] or call [phone].    Pelvic Pain, Female Pelvic pain is pain in your lower abdomen, below your belly button and between your hips. The pain may start suddenly (be acute), keep coming back (be recurring), or last a long time (become chronic). Pelvic pain that lasts longer than 6 months is considered chronic. Pelvic pain may affect your:  Reproductive organs.  Urinary system.  Digestive tract.  Musculoskeletal system. There are many potential causes of pelvic pain. Sometimes, the pain can be a result of digestive or urinary conditions, strained muscles or ligaments, or reproductive conditions. Sometimes the cause of pelvic pain is not known. Follow these instructions at home:   Take over-the-counter and prescription medicines only as told by your health care provider.  Rest as told by your health care provider.  Do not have sex if it hurts.  Keep a journal of your pelvic pain. Write down: ? When the pain started. ? Where the pain is located. ? What seems to make the pain better or worse, such as food or your period (menstrual cycle). ? Any symptoms you have along with the pain.  Keep all follow-up visits as told by your health care provider. This is important. Contact a health care provider if:  Medicine does not help your pain.  Your pain comes back.  You have new symptoms.  You have abnormal vaginal discharge or bleeding, including bleeding after menopause.  You have a fever or chills.  You are constipated.  You have blood in your urine or stool.  You have foul-smelling urine.  You feel weak or light-headed. Get help right away if:  You have sudden severe pain.  Your pain gets steadily worse.  You have severe  pain along with fever, nausea, vomiting, or excessive sweating.  You lose consciousness. Summary  Pelvic pain is pain in your lower abdomen, below your belly button and between your hips.  There are many potential causes of pelvic pain.  Keep a journal of your pelvic pain. This information is not intended to replace advice given to you by your health care provider. Make sure you discuss any questions you have with your health care provider. Document Revised: 01/20/2018 Document Reviewed: 01/20/2018 Elsevier Patient Education  [place] or Dicyclomine read about   Hyoscyamine tablets What is this medicine? HYOSCYAMINE (hye oh SYE a meen) is used to treat stomach and bladder problems. It is also used for rhinitis, to reduce some problems caused by Parkinson's disease, and for the treatment of poisoning with drugs that are usually used to treat myasthenia gravis. This medicine may be used for other purposes; ask your health care provider or pharmacist if you have questions. COMMON BRAND NAME(S): Geoffery Spruce, HyoMax, Hyospaz, Levsin, Losamine, Dorene Sorrow, Spasdel What should I tell my health care provider before I take this medicine? They need to know if you have any of these conditions:  difficulty passing urine  glaucoma  heart disease, or previous heart attack  myasthenia gravis  prostate trouble  stomach obstruction  ulcerative colitis  an unusual or allergic reaction to hyoscyamine, other medicines, foods, dyes, or preservatives  pregnant or trying to get pregnant  breast-feeding How should I use this medicine? Take this medicine by mouth with a glass of water. Follow the directions on the prescription label. Take your doses at  regular intervals. Do not take your medicine more often than directed. Talk to your pediatrician regarding the use of this medicine in children. Special care may be needed. While this medicine may be prescribed for  children as young as 12 years for selected conditions, precautions do apply. Overdosage: If you think you have taken too much of this medicine contact a poison control center or emergency room at once. NOTE: This medicine is only for you. Do not share this medicine with others. What if I miss a dose? If you miss a dose, take it as soon as you can. If it is almost time for your next dose, take only that dose. Do not take double or extra doses. What may interact with this medicine?  amantadine  antacids  benztropine  donepezil  galantamine  medicines for hay fever and other allergies  medicines for mental depression  medicines for mental problems or psychotic disturbances  rivastigmine  tacrine This list may not describe all possible interactions. Give your health care provider a list of all the medicines, herbs, non-prescription drugs, or dietary supplements you use. Also tell them if you smoke, drink alcohol, or use illegal drugs. Some items may interact with your medicine. What should I watch for while using this medicine? You may get dizzy. Do not drive, use machinery, or do anything that needs mental alertness until you know how this medicine affects you. To reduce the risk of dizzy or fainting spells, do not sit or stand up quickly, especially if you are an older patient. Alcohol can make you more dizzy. Avoid alcoholic drinks. Stay out of bright light and wear sunglasses if this medicine makes your eyes more sensitive to light. Your mouth may get dry. Chewing sugarless gum or sucking hard candy, and drinking plenty of water may help. Contact your doctor if the problem does not go away or is severe. This medicine may cause dry eyes and blurred vision. If you wear contact lenses you may feel some discomfort. Lubricating drops may help. See your eye doctor if the problem does not go away or is severe. Avoid extreme heat (e.g., hot tubs, saunas). This medicine can cause you to sweat  less than normal. Your body temperature could increase to dangerous levels, which may lead to heat stroke. What side effects may I notice from receiving this medicine? Side effects that you should report to your doctor or health care professional as soon as possible:  anxiety, nervousness  confusion  dizziness or fainting spells  fast heartbeat  fever  pain or difficulty passing urine  unusually weak or tired  vomiting Side effects that usually do not require medical attention (report to your doctor or health care professional if they continue or are bothersome):  altered taste  constipation  nausea This list may not describe all possible side effects. Call your doctor for medical advice about side effects. You may report side effects to FDA at 1-800-FDA-1088. Where should I keep my medicine? Keep out of the reach of children. Store at room temperature between 15 and 30 degrees C (59 and 86 degrees F). Throw away any unused medicine after the expiration date. NOTE: This sheet is a summary. It may not cover all possible information. If you have questions about this medicine, talk to your doctor, pharmacist, or health care provider.  2020 Elsevier/Gold Standard (2007-12-17 15:05:05)  Dicyclomine tablets or capsules What is this medicine? DICYCLOMINE (dye SYE kloe meen) is used to treat bowel problems including irritable bowel  syndrome. This medicine may be used for other purposes; ask your health care provider or pharmacist if you have questions. COMMON BRAND NAME(S): Bentyl What should I tell my health care provider before I take this medicine? They need to know if you have any of these conditions:  difficulty passing urine  esophagus problems or heartburn  glaucoma  heart disease, or previous heart attack  myasthenia gravis  prostate trouble  stomach infection, or obstruction  ulcerative colitis  an unusual or allergic reaction to dicyclomine, other  medicines, foods, dyes, or preservatives  pregnant or trying to get pregnant  breast-feeding How should I use this medicine? Take this medicine by mouth with a glass of water. Follow the directions on the prescription label. It is best to take this medicine on an empty stomach, 30 minutes to 1 hour before meals. Take your medicine at regular intervals. Do not take your medicine more often than directed. Talk to your pediatrician regarding the use of this medicine in children. Special care may be needed. While this drug may be prescribed for children as young as 34 months of age for selected conditions, precautions do apply. Patients over 41 years old may have a stronger reaction and need a smaller dose. Overdosage: If you think you have taken too much of this medicine contact a poison control center or emergency room at once. NOTE: This medicine is only for you. Do not share this medicine with others. What if I miss a dose? If you miss a dose, take it as soon as you can. If it is almost time for your next dose, take only that dose. Do not take double or extra doses. What may interact with this medicine?  amantadine  antacids  benztropine  digoxin  disopyramide  medicines for allergies, colds and breathing difficulties  medicines for alzheimer's disease  medicines for anxiety or sleeping problems  medicines for depression or psychotic disturbances  medicines for diarrhea  medicines for pain  metoclopramide  tegaserod This list may not describe all possible interactions. Give your health care provider a list of all the medicines, herbs, non-prescription drugs, or dietary supplements you use. Also tell them if you smoke, drink alcohol, or use illegal drugs. Some items may interact with your medicine. What should I watch for while using this medicine? You may get drowsy, dizzy, or have blurred vision. Do not drive, use machinery, or do anything that needs mental alertness until  you know how this medicine affects you. To reduce the risk of dizzy or fainting spells, do not sit or stand up quickly, especially if you are an older patient. Alcohol can make you more drowsy, avoid alcoholic drinks. Stay out of bright light and wear sunglasses if this medicine makes your eyes more sensitive to light. Avoid extreme heat (hot tubs, saunas). This medicine can cause you to sweat less than normal. Your body temperature could increase to dangerous levels, which may lead to heat stroke. Antacids can stop this medicine from working. If you get an upset stomach and want to take an antacid, make sure there is an interval of at least 1 to 2 hours before or after you take this medicine. Your mouth may get dry. Chewing sugarless gum or sucking hard candy, and drinking plenty of water may help. Contact your doctor if the problem does not go away or is severe. What side effects may I notice from receiving this medicine? Side effects that you should report to your doctor or health care  professional as soon as possible:  agitation, nervousness, confusion  difficulty swallowing  dizziness, drowsiness  fast or slow heartbeat  hallucinations  pain or difficulty passing urine Side effects that usually do not require medical attention (report to your doctor or health care professional if they continue or are bothersome):  constipation  headache  nausea or vomiting  sexual difficulty This list may not describe all possible side effects. Call your doctor for medical advice about side effects. You may report side effects to FDA at 1-800-FDA-1088. Where should I keep my medicine? Keep out of the reach of children. Store at room temperature below 30 degrees C (86 degrees F). Protect from light. Throw away any unused medicine after the expiration date. NOTE: This sheet is a summary. It may not cover all possible information. If you have questions about this medicine, talk to your doctor,  pharmacist, or health care provider.  2020 Elsevier/Gold Standard (2007-11-23 17:12:34)  Low-FODMAP Eating Plan  FODMAPs (fermentable oligosaccharides, disaccharides, monosaccharides, and polyols) are sugars that are hard for some people to digest. A low-FODMAP eating plan may help some people who have bowel (intestinal) diseases to manage their symptoms. This meal plan can be complicated to follow. Work with a diet and nutrition specialist (dietitian) to make a low-FODMAP eating plan that is right for you. A dietitian can make sure that you get enough nutrition from this diet. What are tips for following this plan? Reading food labels  Check labels for hidden FODMAPs such as: ? High-fructose syrup. ? Honey. ? Agave. ? Natural fruit flavors. ? Onion or garlic powder.  Choose low-FODMAP foods that contain 3-4 grams of fiber per serving.  Check food labels for serving sizes. Eat only one serving at a time to make sure FODMAP levels stay low. Meal planning  Follow a low-FODMAP eating plan for up to 6 weeks, or as told by your health care provider or dietitian.  To follow the eating plan: 1. Eliminate high-FODMAP foods from your diet completely. 2. Gradually reintroduce high-FODMAP foods into your diet one at a time. Most people should wait a few days after introducing one high-FODMAP food before they introduce the next high-FODMAP food. Your dietitian can recommend how quickly you may reintroduce foods. 3. Keep a daily record of what you eat and drink, and make note of any symptoms that you have after eating. 4. Review your daily record with a dietitian regularly. Your dietitian can help you identify which foods you can eat and which foods you should avoid. General tips  Drink enough fluid each day to keep your urine pale yellow.  Avoid processed foods. These often have added sugar and may be high in FODMAPs.  Avoid most dairy products, whole grains, and sweeteners.  Work with a  dietitian to make sure you get enough fiber in your diet. Recommended foods Grains  Gluten-free grains, such as rice, oats, buckwheat, quinoa, corn, polenta, and millet. Gluten-free pasta, bread, or cereal. Rice noodles. Corn tortillas. Vegetables  Eggplant, zucchini, cucumber, peppers, green beans, Brussels sprouts, bean sprouts, lettuce, arugula, kale, Swiss chard, spinach, collard greens, bok choy, summer squash, potato, and tomato. Limited amounts of corn, carrot, and sweet potato. Green parts of scallions. Fruits  Bananas, oranges, lemons, limes, blueberries, raspberries, strawberries, grapes, cantaloupe, honeydew melon, kiwi, papaya, passion fruit, and pineapple. Limited amounts of dried cranberries, banana chips, and shredded coconut. Dairy  Lactose-free milk, yogurt, and kefir. Lactose-free cottage cheese and ice cream. Non-dairy milks, such as almond, coconut, hemp,  and rice milk. Yogurts made of non-dairy milks. Limited amounts of goat cheese, brie, mozzarella, parmesan, swiss, and other hard cheeses. Meats and other protein foods  Unseasoned beef, pork, poultry, or fish. Eggs. Berniece Salines. Tofu (firm) and tempeh. Limited amounts of nuts and seeds, such as almonds, walnuts, [place] nuts, pecans, peanuts, pumpkin seeds, chia seeds, and sunflower seeds. Fats and oils  Butter-free spreads. Vegetable oils, such as olive, canola, and sunflower oil. Seasoning and other foods  Artificial sweeteners with names that do not end in "ol" such as aspartame, saccharine, and stevia. Maple syrup, white table sugar, raw sugar, brown sugar, and molasses. Fresh basil, coriander, parsley, rosemary, and thyme. Beverages  Water and mineral water. Sugar-sweetened soft drinks. Small amounts of orange juice or cranberry juice. Black and green tea. Most dry wines. Coffee. This may not be a complete list of low-FODMAP foods. Talk with your dietitian for more information. Foods to avoid Grains  Wheat, including  kamut, durum, and semolina. Barley and bulgur. Couscous. Wheat-based cereals. Wheat noodles, bread, crackers, and pastries. Vegetables  Chicory root, artichoke, asparagus, cabbage, snow peas, sugar snap peas, mushrooms, and cauliflower. Onions, garlic, leeks, and the white part of scallions. Fruits  Fresh, dried, and juiced forms of apple, pear, watermelon, peach, plum, cherries, apricots, blackberries, boysenberries, figs, nectarines, and mango. Avocado. Dairy  Milk, yogurt, ice cream, and soft cheese. Cream and sour cream. Milk-based sauces. Custard. Meats and other protein foods  Fried or fatty meat. Sausage. Cashews and pistachios. Soybeans, baked beans, black beans, chickpeas, kidney beans, fava beans, navy beans, lentils, and split peas. Seasoning and other foods  Any sugar-free gum or candy. Foods that contain artificial sweeteners such as sorbitol, mannitol, isomalt, or xylitol. Foods that contain honey, high-fructose corn syrup, or agave. Bouillon, vegetable stock, beef stock, and chicken stock. Garlic and onion powder. Condiments made with onion, such as hummus, chutney, pickles, relish, salad dressing, and salsa. Tomato paste. Beverages  Chicory-based drinks. Coffee substitutes. Chamomile tea. Fennel tea. Sweet or fortified wines such as port or sherry. Diet soft drinks made with isomalt, mannitol, maltitol, sorbitol, or xylitol. Apple, pear, and mango juice. Juices with high-fructose corn syrup. This may not be a complete list of high-FODMAP foods. Talk with your dietitian to discuss what dietary choices are best for you.  Summary  A low-FODMAP eating plan is a short-term diet that eliminates FODMAPs from your diet to help ease symptoms of certain bowel diseases.  The eating plan usually lasts up to 6 weeks. After that, high-FODMAP foods are restarted gradually, one at a time, so you can find out which may be causing symptoms.  A low-FODMAP eating plan can be complicated. It is  best to work with a dietitian who has experience with this type of plan. This information is not intended to replace advice given to you by your health care provider. Make sure you discuss any questions you have with your health care provider. Document Revised: 07/17/2017 Document Reviewed: 03/31/2017 Elsevier Patient Education  [place].  Irritable Bowel Syndrome, Adult  Irritable bowel syndrome (IBS) is a group of symptoms that affects the organs responsible for digestion (gastrointestinal or GI tract). IBS is not one specific disease. To regulate how the GI tract works, the body sends signals back and forth between the intestines and the brain. If you have IBS, there may be a problem with these signals. As a result, the GI tract does not function normally. The intestines may become more sensitive and overreact to  certain things. This may be especially true when you eat certain foods or when you are under stress. There are four types of IBS. These may be determined based on the consistency of your stool (feces):  IBS with diarrhea.  IBS with constipation.  Mixed IBS.  Unsubtyped IBS. It is important to know which type of IBS you have. Certain treatments are more likely to be helpful for certain types of IBS. What are the causes? The exact cause of IBS is not known. What increases the risk? You may have a higher risk for IBS if you:  Are female.  Are younger than 78.  Have a family history of IBS.  Have a mental health condition, such as depression, anxiety, or post-traumatic stress disorder.  Have had a bacterial infection of your GI tract. What are the signs or symptoms? Symptoms of IBS vary from person to person. The main symptom is abdominal pain or discomfort. Other symptoms usually include one or more of the following:  Diarrhea, constipation, or both.  Abdominal swelling or bloating.  Feeling full after eating a small or regular-sized meal.  Frequent  gas.  Mucus in the stool.  A feeling of having more stool left after a bowel movement. Symptoms tend to come and go. They may be triggered by stress, mental health conditions, or certain foods. How is this diagnosed? This condition may be diagnosed based on a physical exam, your medical history, and your symptoms. You may have tests, such as:  Blood tests.  Stool test.  X-rays.  CT scan.  Colonoscopy. This is a procedure in which your GI tract is viewed with a long, thin, flexible tube. How is this treated? There is no cure for IBS, but treatment can help relieve symptoms. Treatment depends on the type of IBS you have, and may include:  Changes to your diet, such as: ? Avoiding foods that cause symptoms. ? Drinking more water. ? Following a low-FODMAP (fermentable oligosaccharides, disaccharides, monosaccharides, and polyols) diet for up to 6 weeks, or as told by your health care provider. FODMAPs are sugars that are hard for some people to digest. ? Eating more fiber. ? Eating medium-sized meals at the same times every day.  Medicines. These may include: ? Fiber supplements, if you have constipation. ? Medicine to control diarrhea (antidiarrheal medicines). ? Medicine to help control muscle tightening (spasms) in your GI tract (antispasmodic medicines). ? Medicines to help with mental health conditions, such as antidepressants or tranquilizers.  Talk therapy or counseling.  Working with a diet and nutrition specialist (dietitian) to help create a food plan that is right for you.  Managing your stress. Follow these instructions at home: Eating and drinking  Eat a healthy diet.  Eat medium-sized meals at about the same time every day. Do not eat large meals.  Gradually eat more fiber-rich foods. These include whole grains, fruits, and vegetables. This may be especially helpful if you have IBS with constipation.  Eat a diet low in FODMAPs.  Drink enough fluid to keep  your urine pale yellow.  Keep a journal of foods that seem to trigger symptoms.  Avoid foods and drinks that: ? Contain added sugar. ? Make your symptoms worse. Dairy products, caffeinated drinks, and carbonated drinks can make symptoms worse for some people. General instructions  Take over-the-counter and prescription medicines and supplements only as told by your health care provider.  Get enough exercise. Do at least 150 minutes of moderate-intensity exercise each week.  Manage  your stress. Getting enough sleep and exercise can help you manage stress.  Keep all follow-up visits as told by your health care provider and therapist. This is important. Alcohol Use  Do not drink alcohol if: ? Your health care provider tells you not to drink. ? You are pregnant, may be pregnant, or are planning to become pregnant.  If you drink alcohol, limit how much you have: ? 0-1 drink a day for women. ? 0-2 drinks a day for men.  Be aware of how much alcohol is in your drink. In the [place] one drink equals one typical bottle of beer (12 oz), one-half glass of wine (5 oz), or one shot of hard liquor (1 oz). Contact a health care provider if you have:  Constant pain.  Weight loss.  Difficulty or pain when swallowing.  Diarrhea that gets worse. Get help right away if you have:  Severe abdominal pain.  Fever.  Diarrhea with symptoms of dehydration, such as dizziness or dry mouth.  Bright red blood in your stool.  Stool that is black and tarry.  Abdominal swelling.  Vomiting that does not stop.  Blood in your vomit. Summary  Irritable bowel syndrome (IBS) is not one specific disease. It is a group of symptoms that affects digestion.  Your intestines may become more sensitive and overreact to certain things. This may be especially true when you eat certain foods or when you are under stress.  There is no cure for IBS, but treatment can help relieve symptoms. This information is  not intended to replace advice given to you by your health care provider. Make sure you discuss any questions you have with your health care provider. Document Revised: 07/28/2017 Document Reviewed: 07/28/2017 Elsevier Patient Education  2020 [place].  Diet for Irritable Bowel Syndrome When you have irritable bowel syndrome (IBS), it is very important to eat the foods and follow the eating habits that are best for your condition. IBS may cause various symptoms such as pain in the abdomen, constipation, or diarrhea. Choosing the right foods can help to ease the discomfort from these symptoms. Work with your health care provider and diet and nutrition specialist (dietitian) to find the eating plan that will help to control your symptoms. What are tips for following this plan?      Keep a food diary. This will help you identify foods that cause symptoms. Write down: ? What you eat and when you eat it. ? What symptoms you have. ? When symptoms occur in relation to your meals, such as "pain in abdomen 2 hours after dinner."  Eat your meals slowly and in a relaxed setting.  Aim to eat 5-6 small meals per day. Do not skip meals.  Drink enough fluid to keep your urine pale yellow.  Ask your health care provider if you should take an over-the-counter probiotic to help restore healthy bacteria in your gut (digestive tract). ? Probiotics are foods that contain good bacteria and yeasts.  Your dietitian may have specific dietary recommendations for you based on your symptoms. He or she may recommend that you: ? Avoid foods that cause symptoms. Talk with your dietitian about other ways to get the same nutrients that are in those problem foods. ? Avoid foods with gluten. Gluten is a protein that is found in rye, wheat, and barley. ? Eat more foods that contain soluble fiber. Examples of foods with high soluble fiber include oats, seeds, and certain fruits and vegetables. Take a fiber  supplement if  directed by your dietitian. ? Reduce or avoid certain foods called FODMAPs. These are foods that contain carbohydrates that are hard to digest. Ask your doctor which foods contain these carbohydrates. What foods are not recommended? The following are some foods and drinks that may make your symptoms worse:  Fatty foods, such as french fries.  Foods that contain gluten, such as pasta and cereal.  Dairy products, such as milk, cheese, and ice cream.  Chocolate.  Alcohol.  Products with caffeine, such as coffee.  Carbonated drinks, such as soda.  Foods that are high in FODMAPs. These include certain fruits and vegetables.  Products with sweeteners such as honey, high fructose corn syrup, sorbitol, and mannitol. The items listed above may not be a complete list of foods and beverages you should avoid. Contact a dietitian for more information. What foods are good sources of fiber? Your health care provider or dietitian may recommend that you eat more foods that contain fiber. Fiber can help to reduce constipation and other IBS symptoms. Add foods with fiber to your diet a little at a time so your body can get used to them. Too much fiber at one time might cause gas and swelling of your abdomen. The following are some foods that are good sources of fiber:  Berries, such as raspberries, strawberries, and blueberries.  Tomatoes.  Carrots.  Brown rice.  Oats.  Seeds, such as chia and pumpkin seeds. The items listed above may not be a complete list of recommended sources of fiber. Contact your dietitian for more options. Where to find more information  International Foundation for Functional Gastrointestinal Disorders: www.iffgd.[employer] and Digestive and Kidney Diseases: [employer] Summary  When you have irritable bowel syndrome (IBS), it is very important to eat the foods and follow the eating habits that are best for your condition.  IBS may  cause various symptoms such as pain in the abdomen, constipation, or diarrhea.  Choosing the right foods can help to ease the discomfort that comes from symptoms.  Keep a food diary. This will help you identify foods that cause symptoms.  Your health care provider or diet and nutrition specialist (dietitian) may recommend that you eat more foods that contain fiber. This information is not intended to replace advice given to you by your health care provider. Make sure you discuss any questions you have with your health care provider. Document Revised: 11/24/2018 Document Reviewed: 04/07/2017 Elsevier Patient Education  [place].

## 2019-12-23 DIAGNOSIS — R1032 Left lower quadrant pain: Secondary | ICD-10-CM | POA: Diagnosis not present

## 2019-12-23 DIAGNOSIS — M6281 Muscle weakness (generalized): Secondary | ICD-10-CM | POA: Diagnosis not present

## 2019-12-23 DIAGNOSIS — R102 Pelvic and perineal pain: Secondary | ICD-10-CM | POA: Diagnosis not present

## 2019-12-23 DIAGNOSIS — R1031 Right lower quadrant pain: Secondary | ICD-10-CM | POA: Diagnosis not present

## 2020-01-20 DIAGNOSIS — R1031 Right lower quadrant pain: Secondary | ICD-10-CM | POA: Diagnosis not present

## 2020-01-20 DIAGNOSIS — R1032 Left lower quadrant pain: Secondary | ICD-10-CM | POA: Diagnosis not present

## 2020-01-20 DIAGNOSIS — M6281 Muscle weakness (generalized): Secondary | ICD-10-CM | POA: Diagnosis not present

## 2020-01-20 DIAGNOSIS — R102 Pelvic and perineal pain: Secondary | ICD-10-CM | POA: Diagnosis not present

## 2020-03-02 DIAGNOSIS — M6281 Muscle weakness (generalized): Secondary | ICD-10-CM | POA: Diagnosis not present

## 2020-03-02 DIAGNOSIS — R1032 Left lower quadrant pain: Secondary | ICD-10-CM | POA: Diagnosis not present

## 2020-03-02 DIAGNOSIS — R1031 Right lower quadrant pain: Secondary | ICD-10-CM | POA: Diagnosis not present

## 2020-03-02 DIAGNOSIS — R102 Pelvic and perineal pain: Secondary | ICD-10-CM | POA: Diagnosis not present

## 2020-04-17 ENCOUNTER — Telehealth: Payer: BC Managed Care – PPO | Admitting: Physician Assistant

## 2020-04-17 DIAGNOSIS — N760 Acute vaginitis: Secondary | ICD-10-CM | POA: Diagnosis not present

## 2020-04-17 MED ORDER — FLUCONAZOLE 150 MG PO TABS: 150.0000 mg | ORAL_TABLET | Freq: Once | ORAL | 0 refills | Status: AC

## 2020-04-17 NOTE — Progress Notes (Signed)

## 2020-04-19 DIAGNOSIS — R1032 Left lower quadrant pain: Secondary | ICD-10-CM | POA: Diagnosis not present

## 2020-04-19 DIAGNOSIS — R102 Pelvic and perineal pain: Secondary | ICD-10-CM | POA: Diagnosis not present

## 2020-04-19 DIAGNOSIS — R1031 Right lower quadrant pain: Secondary | ICD-10-CM | POA: Diagnosis not present

## 2020-04-19 DIAGNOSIS — M6281 Muscle weakness (generalized): Secondary | ICD-10-CM | POA: Diagnosis not present

## 2020-05-12 ENCOUNTER — Other Ambulatory Visit: Payer: Self-pay

## 2020-05-12 ENCOUNTER — Encounter: Payer: Self-pay | Admitting: Emergency Medicine

## 2020-05-12 ENCOUNTER — Ambulatory Visit
Admission: EM | Admit: 2020-05-12 | Discharge: 2020-05-12 | Disposition: A | Discharge status: Home / Self Care | Payer: BC Managed Care – PPO | Attending: Family Medicine | Admitting: Family Medicine

## 2020-05-12 DIAGNOSIS — Z1152 Encounter for screening for COVID-19: Secondary | ICD-10-CM | POA: Diagnosis not present

## 2020-05-12 DIAGNOSIS — J019 Acute sinusitis, unspecified: Secondary | ICD-10-CM

## 2020-05-12 NOTE — ED Triage Notes (Signed)
Pt here for nasal congestion and some sore throat x 3 days

## 2020-05-12 NOTE — Discharge Instructions (Addendum)
Covid test results should be available sometime on Tuesday.  Work note provided to return back to work on Wednesday with proof of a negative Covid test.  Recommend at this point symptom treatment with Flonase nasal spray daily to help with congestion, antihistamine therapy with cetirizine or levo cetirizine, Sudafed for decongestant.  Hydrate well with water.  Tylenol or ibuprofen as needed if fever develops.  If any symptoms of shortness of breath, persistent cough or wheezing develop would recommend reevaluation.  If any of your symptoms worsen or become severe would recommend reCovid testing as symptoms have been present for less than 72 hours.

## 2020-05-12 NOTE — ED Provider Notes (Signed)
[facility]    CSN: [id number] Arrival date & time: 05/12/20  0818      History   Chief Complaint Chief Complaint  Patient presents with  . Nasal Congestion  . Sore Throat    HPI Mariah Brennan is a 34 y.o. female.   HPI  Patient presents with nasal congestion and sore throat x3 days. Patient reports that she recently spent the night in the hospital with husband and is concerned will sinus infection versus Covid exposure.  Patient has remained afebrile.  Is not having any symptoms of shortness of breath or wheezing.  Patient works in the [employer] however no known exposures.  Past Medical History:  Diagnosis Date  . Blood in stool   . Constipation     Patient Active Problem List   Diagnosis Date Noted  . Fibroids 12/07/2019  . Annual physical exam 12/07/2019  . Acute vulvitis 09/07/2019  . Dyspareunia in female 09/07/2019  . Candidal vulvovaginitis 09/07/2019  . Constipation 09/07/2019  . Female fertility problem 09/07/2019    Past Surgical History:  Procedure Laterality Date  . WISDOM TOOTH EXTRACTION      OB History    Gravida  0   Para  0   Term  0   Preterm  0   AB  0   Living  0     SAB  0   TAB  0   Ectopic  0   Multiple  0   Live Births               Home Medications    Prior to Admission medications   Medication Sig Start Date End Date Taking? Authorizing Provider  Cyanocobalamin (B-12 PO) Take by mouth.    [provider]  folic acid (FOLVITE) 1 MG tablet Take 1 mg by mouth daily.    [provider]    Family History Family History  Problem Relation Age of Onset  . Stroke Maternal Grandfather   . Diabetes Mother        prediabetes  . Diabetes Father        prediabetes    Social History Social History   Tobacco Use  . Smoking status: Never Smoker  . Smokeless tobacco: Never Used  Substance Use Topics  . Alcohol use: No    Alcohol/week: 0.0 standard drinks  . Drug  use: No     Allergies   Flexeril [cyclobenzaprine]   Review of Systems Review of Systems Pertinent negatives listed in HPI  Physical Exam Triage Vital Signs ED Triage Vitals  Enc Vitals Group     BP 05/12/20 0828 113/74     Pulse Rate 05/12/20 0828 83     Resp 05/12/20 0828 18     Temp 05/12/20 0828 99.3 F (37.4 C)     Temp Source 05/12/20 0828 Oral     SpO2 05/12/20 0828 99 %     Weight --      Height --      Head Circumference --      Peak Flow --      Pain Score 05/12/20 0831 4     Pain Loc --      Pain Edu? --      Excl. in [place]? --    No data found.  Updated Vital Signs BP 113/74 (BP Location: Left Arm)   Pulse 83   Temp 99.3 F (37.4 C) (Oral)   Resp 18   SpO2  99%   Visual Acuity Right Eye Distance:   Left Eye Distance:   Bilateral Distance:    Right Eye Near:   Left Eye Near:    Bilateral Near:     Physical Exam Constitutional:      General: She is not in acute distress.    Appearance: She is well-developed. She is not ill-appearing.  HENT:     Head: Normocephalic and atraumatic.     Nose: Congestion present.     Right Turbinates: Enlarged and swollen.     Left Turbinates: Not enlarged.  Cardiovascular:     Rate and Rhythm: Normal rate and regular rhythm.  Pulmonary:     Effort: Pulmonary effort is normal.     Breath sounds: Normal breath sounds.  Musculoskeletal:     Cervical back: Normal range of motion and neck supple.  Neurological:     Mental Status: She is alert.      UC Treatments / Results  Labs (all labs ordered are listed, but only abnormal results are displayed) Labs Reviewed  NOVEL CORONAVIRUS, NAA    EKG   Radiology No results found.  Procedures Procedures (including critical care time)  Medications Ordered in UC Medications - No data to display  Initial Impression / Assessment and Plan / UC Course  I have reviewed the triage vital signs and the nursing notes.  Pertinent labs & imaging results that were  available during my care of the patient were reviewed by me and considered in my medical decision making (see chart for details).    COVID-19 test pending recommended symptomatic treatment at present her symptoms have been present for approximately 2-1/2 to 3 days.  Recommended Flonase, antihistamine and Sudafed for symptomatic management.  If any other symptoms develop or symptoms worsen did recommend retesting as patient is testing rather soon compared to the timeframe that symptoms develop.  Patient given of note to allow for time for Covid test to result given current symptoms.  Red flags discussed.  Patient verbalized understanding agreement plan. Final Clinical Impressions(s) / UC Diagnoses   Final diagnoses:  Encounter for screening for COVID-19  Viral sinorhinitis   Discharge Instructions   None    ED Prescriptions    None     PDMP not reviewed this encounter.   Scot Jun, Rozel 05/12/20 [phone]

## 2020-05-14 LAB — SARS-COV-2, NAA 2 DAY TAT

## 2020-05-14 LAB — NOVEL CORONAVIRUS, NAA: SARS-CoV-2, NAA: NOT DETECTED

## 2020-05-17 DIAGNOSIS — J019 Acute sinusitis, unspecified: Secondary | ICD-10-CM | POA: Diagnosis not present

## 2020-05-17 DIAGNOSIS — B9689 Other specified bacterial agents as the cause of diseases classified elsewhere: Secondary | ICD-10-CM | POA: Diagnosis not present

## 2020-05-23 DIAGNOSIS — K644 Residual hemorrhoidal skin tags: Secondary | ICD-10-CM | POA: Diagnosis not present

## 2020-06-28 DIAGNOSIS — Z124 Encounter for screening for malignant neoplasm of cervix: Secondary | ICD-10-CM | POA: Diagnosis not present

## 2020-06-28 DIAGNOSIS — Z136 Encounter for screening for cardiovascular disorders: Secondary | ICD-10-CM | POA: Diagnosis not present

## 2020-06-28 DIAGNOSIS — Z0000 Encounter for general adult medical examination without abnormal findings: Secondary | ICD-10-CM | POA: Diagnosis not present

## 2020-06-28 DIAGNOSIS — Z6820 Body mass index (BMI) 20.0-20.9, adult: Secondary | ICD-10-CM | POA: Diagnosis not present

## 2020-07-05 DIAGNOSIS — K644 Residual hemorrhoidal skin tags: Secondary | ICD-10-CM | POA: Diagnosis not present

## 2020-07-05 DIAGNOSIS — F411 Generalized anxiety disorder: Secondary | ICD-10-CM | POA: Diagnosis not present

## 2020-07-31 ENCOUNTER — Ambulatory Visit: Payer: BC Managed Care – PPO | Admitting: Internal Medicine

## 2020-08-20 ENCOUNTER — Telehealth: Payer: BC Managed Care – PPO | Admitting: Nurse Practitioner

## 2020-08-20 DIAGNOSIS — N760 Acute vaginitis: Secondary | ICD-10-CM

## 2020-08-20 MED ORDER — FLUCONAZOLE 150 MG PO TABS: 150.0000 mg | ORAL_TABLET | Freq: Once | ORAL | 0 refills | Status: AC

## 2020-08-20 NOTE — Progress Notes (Signed)

## 2020-09-19 ENCOUNTER — Ambulatory Visit
Admission: RE | Admit: 2020-09-19 | Discharge: 2020-09-19 | Disposition: A | Discharge status: Home / Self Care | Payer: BC Managed Care – PPO | Source: Ambulatory Visit

## 2020-09-19 ENCOUNTER — Ambulatory Visit
Admission: EM | Admit: 2020-09-19 | Discharge: 2020-09-19 | Disposition: A | Discharge status: Home / Self Care | Payer: BC Managed Care – PPO | Attending: Family Medicine | Admitting: Family Medicine

## 2020-09-19 ENCOUNTER — Other Ambulatory Visit: Payer: Self-pay

## 2020-09-19 DIAGNOSIS — N898 Other specified noninflammatory disorders of vagina: Secondary | ICD-10-CM | POA: Diagnosis not present

## 2020-09-19 DIAGNOSIS — R1030 Lower abdominal pain, unspecified: Secondary | ICD-10-CM

## 2020-09-19 LAB — POCT URINALYSIS DIP (MANUAL ENTRY)
Bilirubin, UA: NEGATIVE
Glucose, UA: NEGATIVE mg/dL
Nitrite, UA: NEGATIVE
Protein Ur, POC: NEGATIVE mg/dL
Spec Grav, UA: >=1.03 — AB (ref 1.010–1.025)
Urobilinogen, UA: 0.2 E.U./dL
pH, UA: 6.5 (ref 5.0–8.0)

## 2020-09-19 LAB — POCT URINE PREGNANCY: Preg Test, Ur: NEGATIVE

## 2020-09-19 MED ORDER — NYSTATIN-TRIAMCINOLONE 100000-0.1 UNIT/GM-% EX CREA: TOPICAL_CREAM | CUTANEOUS | 0 refills | Status: DC

## 2020-09-19 MED ORDER — FLUCONAZOLE 150 MG PO TABS: 150.0000 mg | ORAL_TABLET | Freq: Every day | ORAL | 0 refills | Status: DC

## 2020-09-19 NOTE — Discharge Instructions (Addendum)
Treating you for a yeast infection Take the medicine as prescribed. 1 today and 1 in 3 days if still having any symptoms.  Urine sent for culture

## 2020-09-19 NOTE — ED Triage Notes (Signed)
Pt c/o vaginal itching, irritation for approx 4 days. Also reports lower abdominal cramping (LMP 01/25).  States was tx for yeast infection in January.  Denies vaginal discharge, abnormal bleeding, dysuria sx. No OTC meds used.

## 2020-09-20 LAB — URINE CULTURE: Culture: <10000 — AB

## 2020-09-20 NOTE — ED Provider Notes (Signed)
[facility]    CSN: [id number] Arrival date & time: 09/19/20  1146      History   Chief Complaint Chief Complaint  Patient presents with  . Vaginal Itching    HPI Mariah Brennan is a 35 y.o. female.   Pt is  35 year old female that presents with  vaginal itching, irritation for approx 4 days. Also reports lower abdominal cramping (LMP 01/25). States was tx for yeast infection in January.Denies vaginal discharge, abnormal bleeding, dysuria sx. No OTC meds used.     Past Medical History:  Diagnosis Date  . Blood in stool   . Constipation     Patient Active Problem List   Diagnosis Date Noted  . Fibroids 12/07/2019  . Annual physical exam 12/07/2019  . Acute vulvitis 09/07/2019  . Dyspareunia in female 09/07/2019  . Candidal vulvovaginitis 09/07/2019  . Constipation 09/07/2019  . Female fertility problem 09/07/2019    Past Surgical History:  Procedure Laterality Date  . WISDOM TOOTH EXTRACTION      OB History    Gravida  0   Para  0   Term  0   Preterm  0   AB  0   Living  0     SAB  0   IAB  0   Ectopic  0   Multiple  0   Live Births               Home Medications    Prior to Admission medications   Medication Sig Start Date End Date Taking? Authorizing Provider  fluconazole (DIFLUCAN) 150 MG tablet Take 1 tablet (150 mg total) by mouth daily. 09/19/20  Yes Loura Halt A, NP  nystatin-triamcinolone (MYCOLOG II) cream Apply to affected area daily 09/19/20  Yes Orvan July, NP    Family History Family History  Problem Relation Age of Onset  . Stroke Maternal Grandfather   . Diabetes Mother        prediabetes  . Diabetes Father        prediabetes    Social History Social History   Tobacco Use  . Smoking status: Never Smoker  . Smokeless tobacco: Never Used  Substance Use Topics  . Alcohol use: No    Alcohol/week: 0.0 standard drinks  . Drug use: No     Allergies   Flexeril [cyclobenzaprine]   Review of  Systems Review of Systems   Physical Exam Triage Vital Signs ED Triage Vitals  Enc Vitals Group     BP 09/19/20 1157 103/73     Pulse Rate 09/19/20 1157 86     Resp 09/19/20 1157 16     Temp 09/19/20 1157 99 F (37.2 C)     Temp Source 09/19/20 1157 Oral     SpO2 09/19/20 1157 98 %     Weight --      Height --      Head Circumference --      Peak Flow --      Pain Score 09/19/20 1154 5     Pain Loc --      Pain Edu? --      Excl. in [place]? --    No data found.  Updated Vital Signs BP 103/73 (BP Location: Left Arm)   Pulse 86   Temp 99 F (37.2 C) (Oral)   Resp 16   LMP 09/11/2020   SpO2 98%   Visual Acuity Right Eye Distance:   Left Eye Distance:  Bilateral Distance:    Right Eye Near:   Left Eye Near:    Bilateral Near:     Physical Exam Vitals and nursing note reviewed.  Constitutional:      General: She is not in acute distress.    Appearance: Normal appearance. She is not ill-appearing, toxic-appearing or diaphoretic.  HENT:     Head: Normocephalic.  Eyes:     Conjunctiva/sclera: Conjunctivae normal.  Pulmonary:     Effort: Pulmonary effort is normal.  Musculoskeletal:        General: Normal range of motion.     Cervical back: Normal range of motion.  Skin:    General: Skin is warm and dry.     Findings: No rash.  Neurological:     Mental Status: She is alert.  Psychiatric:        Mood and Affect: Mood normal.      UC Treatments / Results  Labs (all labs ordered are listed, but only abnormal results are displayed) Labs Reviewed  POCT URINALYSIS DIP (MANUAL ENTRY) - Abnormal; Notable for the following components:      Result Value   Ketones, POC UA trace (5) (*)    Spec Grav, UA >=1.030 (*)    Blood, UA trace-intact (*)    Leukocytes, UA Trace (*)    All other components within normal limits  URINE CULTURE  POCT URINE PREGNANCY    EKG   Radiology No results found.  Procedures Procedures (including critical care  time)  Medications Ordered in UC Medications - No data to display  Initial Impression / Assessment and Plan / UC Course  I have reviewed the triage vital signs and the nursing notes.  Pertinent labs & imaging results that were available during my care of the patient were reviewed by me and considered in my medical decision making (see chart for details).     Vaginal irritation Treating for yeast infection based on hx and symptoms.  Diflucan as prescribed Mycolog as prescribed  Urine sent for culture.  Follow up as needed for continued or worsening symptoms  Final Clinical Impressions(s) / UC Diagnoses   Final diagnoses:  Vaginal irritation     Discharge Instructions     Treating you for a yeast infection Take the medicine as prescribed. 1 today and 1 in 3 days if still having any symptoms.  Urine sent for culture       ED Prescriptions    Medication Sig Dispense Auth. Provider   fluconazole (DIFLUCAN) 150 MG tablet Take 1 tablet (150 mg total) by mouth daily. 2 tablet Atasha Colebank A, NP   nystatin-triamcinolone (MYCOLOG II) cream Apply to affected area daily 15 g Domingos Riggi A, NP     PDMP not reviewed this encounter.   Loura Halt A, NP 09/20/20 [phone]

## 2020-09-26 ENCOUNTER — Encounter: Payer: Self-pay | Admitting: Internal Medicine

## 2020-09-26 ENCOUNTER — Ambulatory Visit: Payer: BC Managed Care – PPO | Admitting: Internal Medicine

## 2020-09-26 ENCOUNTER — Other Ambulatory Visit: Payer: Self-pay

## 2020-09-26 VITALS — BP 116/80 | HR 93 | Temp 98.3°F | Ht 62.0 in | Wt 114.0 lb

## 2020-09-26 DIAGNOSIS — N946 Dysmenorrhea, unspecified: Secondary | ICD-10-CM

## 2020-09-26 DIAGNOSIS — Z1329 Encounter for screening for other suspected endocrine disorder: Secondary | ICD-10-CM

## 2020-09-26 DIAGNOSIS — L700 Acne vulgaris: Secondary | ICD-10-CM | POA: Diagnosis not present

## 2020-09-26 DIAGNOSIS — E538 Deficiency of other specified B group vitamins: Secondary | ICD-10-CM | POA: Diagnosis not present

## 2020-09-26 DIAGNOSIS — Z13220 Encounter for screening for lipoid disorders: Secondary | ICD-10-CM

## 2020-09-26 DIAGNOSIS — R5383 Other fatigue: Secondary | ICD-10-CM | POA: Diagnosis not present

## 2020-09-26 DIAGNOSIS — E559 Vitamin D deficiency, unspecified: Secondary | ICD-10-CM

## 2020-09-26 DIAGNOSIS — N926 Irregular menstruation, unspecified: Secondary | ICD-10-CM

## 2020-09-26 DIAGNOSIS — Z1389 Encounter for screening for other disorder: Secondary | ICD-10-CM

## 2020-09-26 DIAGNOSIS — Z319 Encounter for procreative management, unspecified: Secondary | ICD-10-CM | POA: Diagnosis not present

## 2020-09-26 DIAGNOSIS — Z0000 Encounter for general adult medical examination without abnormal findings: Secondary | ICD-10-CM

## 2020-09-26 DIAGNOSIS — N979 Female infertility, unspecified: Secondary | ICD-10-CM

## 2020-09-26 NOTE — Patient Instructions (Addendum)
Reproductive Endocrine ob/gyn consider discuss with ob/gyn  Work up for endometroisis  replens vaginal dryness restore moisture or estrogen creams      Ref. Range 11/09/2019 07:22  TSH Latest Ref Range: 0.450 - 4.500 uIU/mL 1.150  T4,Free(Direct) Latest Ref Range: 0.82 - 1.77 ng/dL 1.21    Zinc 50 mg daily  Vitamin C [phone] CoQ10 (ob/gyn) Oil of oregano  Elderberry   pregnancy Prenatal vitamin [employer]  Prenatal fish oil/DHA  --->[employer] brand     Endometriosis-Lysteda is treatement   Tranexamic acid oral tablets What is this medicine? TRANEXAMIC ACID (TRAN ex AM ik AS id) slows down or stops blood clots from being broken down. This medicine is used to treat heavy monthly menstrual bleeding. This medicine may be used for other purposes; ask your health care provider or pharmacist if you have questions. COMMON BRAND NAME(S): Cyklokapron, Lysteda What should I tell my health care provider before I take this medicine? They need to know if you have any of these conditions:  bleeding in the brain  blood clotting problems  kidney disease  vision problems  an unusual allergic reaction to tranexamic acid, other medicines, foods, dyes, or preservatives  pregnant or trying to get pregnant  breast-feeding How should I use this medicine? Take this medicine by mouth with a glass of water. Follow the directions on the prescription label. Do not cut, crush, or chew this medicine. You can take it with or without food. If it upsets your stomach, take it with food. Take your medicine at regular intervals. Do not take it more often than directed. Do not stop taking except on your doctor's advice. Do not take this medicine until your period has started. Do not take it for more than 5 days in a row. Do not take this medicine when you do not have your period. Talk to your pediatrician regarding the use of this medicine in children. While this drug may be prescribed for  female children as young as 46 years of age for selected conditions, precautions do apply. Overdosage: If you think you have taken too much of this medicine contact a poison control center or emergency room at once. NOTE: This medicine is only for you. Do not share this medicine with others. What if I miss a dose? If you miss a dose, take it when you remember, and then take your next dose at least 6 hours later. Do not take more than 2 tablets at a time to make up for missed doses. What may interact with this medicine? Do not take this medicine with any of the following medications:  estrogens  birth control pills, patches, injections, rings or other devices that contain both an estrogen and a progestin This medicine may also interact with the following medications:  certain medicines used to help your blood clot  tretinoin (taken by mouth) This list may not describe all possible interactions. Give your health care provider a list of all the medicines, herbs, non-prescription drugs, or dietary supplements you use. Also tell them if you smoke, drink alcohol, or use illegal drugs. Some items may interact with your medicine. What should I watch for while using this medicine? Tell your doctor or healthcare professional if your symptoms do not start to get better or if they get worse. Tell your doctor or healthcare professional if you notice any eye problems while taking this medicine. Your doctor will refer you to an eye doctor who will examine your eyes. What  side effects may I notice from receiving this medicine? Side effects that you should report to your doctor or health care professional as soon as possible:  allergic reactions like skin rash, itching or hives, swelling of the face, lips, or tongue  breathing difficulties  changes in vision  sudden or severe pain in the chest, legs, head, or groin  unusually weak or tired Side effects that usually do not require medical attention  (report to your doctor or health care professional if they continue or are bothersome):  back pain  headache  muscle or joint aches  sinus and nasal problems  stomach pain  tiredness This list may not describe all possible side effects. Call your doctor for medical advice about side effects. You may report side effects to FDA at 1-800-FDA-1088. Where should I keep my medicine? Keep out of the reach of children. Store at room temperature between 15 and 30 degrees C (59 and 86 degrees F). Throw away any unused medicine after the expiration date. NOTE: This sheet is a summary. It may not cover all possible information. If you have questions about this medicine, talk to your doctor, pharmacist, or health care provider.  2021 Elsevier/Gold Standard (2015-09-06 09:12:15)    Endometriosis is a condition in which a tissue similar to the endometrium grows in places outside the uterus. The endometrium is a tissue that forms the lining of the uterus. This tissue can grow in the organs that create the eggs (ovaries), the tubes that carry the eggs to the uterus (fallopian tubes), the vagina, and the bowel. This tissue most often grows on the ovaries and inner lining of the pelvic cavity (peritoneum). When the uterus sheds the endometrium every menstrual cycle, there is bleeding wherever these types of tissue are located. This can cause pain because blood is irritating to tissues that are not normally exposed to it. Endometriosis can also make it harder for a woman to get pregnant. What are the causes? The cause of this condition is not known. What increases the risk? The following factors may make you more likely to develop this condition:  Having a family history of endometriosis.  Having never given birth.  Starting your period at 66 years of age or younger. What are the signs or symptoms? Often, there are no symptoms of this condition. If you do have symptoms, they may:  Vary depending on  where the abnormal tissue is growing.  Occur during your menstrual period (most often) or at the middle of your cycle.  Come and go. You may have no symptoms during some months.  Stop when you no longer have your monthly periods (menopause). Symptoms may include:  Pain in the area between your hip bones (pelvis).  Heavier bleeding during periods.  Menstrual periods that happen more than once a month.  Pain during sex.  Pain in the back or abdomen.  Painful bowel movements.  Not being able to get pregnant. How is this diagnosed? This condition is diagnosed based on your symptoms and a physical exam. You may have tests, such as:  Blood tests and urine tests to help rule out other causes.  Ultrasound to look for tissues that are not normal. This is often done over your skin. It is sometimes done through the vagina (transvaginal).  X-ray of the lower bowel (barium enema).  CT scan.  MRI. To confirm the diagnosis, your health care provider may use a device with a small camera to check tissue inside your abdomen (laparoscopy).  Abnormal tissue may be removed and checked in a lab (biopsy). How is this treated? There is no cure for this condition. Treatment focuses on controlling your symptoms. The type of treatment also depends on whether you want to become pregnant in the future. This condition may be treated with:  Medicines. These may include: ? Medicines to relieve pain, including NSAIDs such as ibuprofen. ? Hormone therapy. This uses artificial hormones to slow the growth of the abnormal tissue. This may include hormonal birth control, such as pills.  Surgery to remove the abnormal tissue. During surgery: ? Tissue may be removed using a laparoscope and a laser (laparoscopic laser treatment). ? The fallopian tubes, uterus, and ovaries may be removed (hysterectomy). This is done in very severe cases. Follow these instructions at home:  Get regular exercise.  Limit alcohol  use.  Eat a balanced diet.  Avoid caffeine.  Take over-the-counter and prescription medicines only as told by your health care provider.  Keep all follow-up visits as told by your health care provider. This is important. Where to find more information  [employer]: EgoNews.co.uk  Office on Women's Health: ChromeDoors.com.ee Contact a health care provider if:  You are having new pain or trouble controlling pain.  You have problems getting pregnant.  You have a fever. Get help right away if you have:  Severe pain that does not get better with medicine.  Severe nausea and vomiting, or if you cannot eat or drink without vomiting.  Pain that affects your abdomen only on the lower, right side.  Pain in your abdomen that gets worse.  Swelling in your abdomen.  Blood in your stool (feces). Summary  Endometriosis is a condition in which a tissue similar to the endometrium grows in places outside the uterus. The endometrium is a tissue that forms the lining of the uterus.  The cause of this condition is not known.  This condition may be treated with medicines to relieve pain, hormone therapy, or surgery.  If you have this condition, get regular exercise, limit alcohol use, and avoid caffeine.  Get help right away if you have severe pain that does not get better with medicine, or if you have severe nausea and vomiting or blood in your stool. This information is not intended to replace advice given to you by your health care provider. Make sure you discuss any questions you have with your health care provider. Document Revised: 09/21/2019 Document Reviewed: 09/21/2019 Elsevier Patient Education  2021 [place].   Dysmenorrhea Dysmenorrhea refers to cramps caused by the muscles of the uterus tightening (contracting) during a menstrual period. Dysmenorrhea may be mild, or it may be severe enough to interfere with  everyday activities for a few days each month. Primary dysmenorrhea is menstrual cramps that last a couple of days when a female starts having menstrual periods or soon after. As a female gets older or has a baby, the cramps will usually lessen or disappear. Secondary dysmenorrhea begins later in life and is caused by a disorder in the reproductive system. It lasts longer, and it may cause more pain than primary dysmenorrhea. The pain may start before the period and last a few days after the period. What are the causes? Dysmenorrhea is usually caused by an underlying problem, such as:  Endometriosis. The tissue that lines the uterus (endometrium) growing outside of the uterus in other areas of the body.  Adenomyosis. Endometrial tissue growing into the muscular walls of the uterus.  Pelvic congestive syndrome. Blood vessels in the pelvis that fill with blood just before the menstrual period.  Overgrowth of cells (polyps) in the endometrium or the lower part of the uterus (cervix).  Uterine prolapse. The uterus dropping down into the vagina due to stretched or weak muscles.  Bladder problems, such as infection or inflammation.  Intestinal problems, such as a tumor or irritable bowel syndrome.  Cancer of the reproductive organs or bladder. Other causes of this condition may result from:  A severely tipped uterus.  A cervix that is closed or has a small opening.  Noncancerous (benign) tumors in the uterus (fibroids).  Pelvic inflammatory disease (PID).  Pelvic scarring (adhesions) from a previous surgery.  An ovarian cyst.  An IUD (intrauterine device). What increases the risk? You are more likely to develop this condition if:  You are younger than 35 years old.  You started puberty early.  You have irregular or heavy bleeding.  You have never given birth.  You have a family history of dysmenorrhea.  You smoke or use nicotine products.  You have high body weight or a  low body weight. What are the signs or symptoms? Symptoms of this condition include:  Cramping, throbbing pain in lower abdomen or lower back, or a feeling of fullness in the lower abdomen.  Periods lasting for longer than 7 days.  Headaches.  Bloating.  Fatigue.  Nausea or vomiting.  Diarrhea or loose stools.  Sweating or dizziness. How is this diagnosed? This condition may be diagnosed based on:  Your symptoms.  Your medical history.  A physical exam.  Blood tests.  A Pap test. This is a test in which cells from the cervix are tested for signs of cancer or infection.  A pregnancy test. You may also have other tests, including:  Imaging tests, such as: ? Ultrasound. ? A procedure to remove and examine a sample of endometrial tissue (dilation and curettage, D&C). ? A procedure to visually examine the inside of:  The uterus (hysteroscopy).  The abdomen or pelvis (laparoscopy).  The bladder (cystoscopy). ? X-rays.  CT scan.  MRI. How is this treated? Treatment depends on the cause of the dysmenorrhea. Treatment may include medicines, such as:  Pain medicines.  Hormone replacement therapy. ? Injections of progesterone to stop the menstrual period. ? Birth control pills that contain the hormone progesterone. ? An IUD that contains the hormone progesterone.  NSAIDs, such as ibuprofen. These may help to stop the production of hormones that cause cramps.  Antidepressant medicines. Other treatment may include:  Surgery to remove adhesions, endometriosis, ovarian cysts, fibroids, or the entire uterus (hysterectomy).  Endometrial ablation. This is a procedure to destroy the endometrium.  Presacral neurectomy. This is a procedure to cut the nerves in the bottom of the spine (sacrum) that go to the reproductive organs.  Sacral nerve stimulation. This is a procedure to apply an electric current to nerves in the sacrum.  Exercise and physical  therapy.  Meditation, yoga, and acupuncture. Work with your health care provider to determine what treatment or combination of treatments is best for you. Follow these instructions at home: Relieving pain and cramping  If directed, apply heat to your lower back or abdomen when you experience pain or cramps. Use the heat source that your health care provider recommends, such as a moist heat pack or a heating pad. ? Place a towel between your skin and the heat source. ? Leave the heat on for 20-30 minutes. ?  Remove the heat if your skin turns bright red. This is especially important if you are unable to feel pain, heat, or cold. You may have a greater risk of getting burned.  Do not sleep with a heating pad on.  Exercise. Activities such as walking, swimming, or biking can help to relieve cramps.  Massage your lower back or abdomen to help relieve pain.   General instructions  Take over-the-counter and prescription medicines only as told by your health care provider.  Ask your health care provider if the medicine prescribed to you requires you to avoid driving or using machinery.  Avoid alcohol and caffeine during and right before your period. These can make cramps worse.  Do not use any products that contain nicotine or tobacco. These products include cigarettes, chewing tobacco, and vaping devices, such as e-cigarettes. If you need help quitting, ask your health care provider.  Keep all follow-up visits. This is important. Contact a health care provider if:  You have pain that gets worse or does not get better with medicine.  You have pain with sex.  You develop nausea or vomiting with your period that is not controlled with medicine. Get help right away if:  You faint. Summary  Dysmenorrhea refers to cramps caused by the muscles of the uterus tightening (contracting) during a menstrual period.  Dysmenorrhea may be mild, or it may be severe enough to interfere with everyday  activities for a few days each month.  Treatment depends on the cause of the dysmenorrhea.  Work with your health care provider to determine what treatment or combination of treatments is best for you. This information is not intended to replace advice given to you by your health care provider. Make sure you discuss any questions you have with your health care provider. Document Revised: 03/21/2020 Document Reviewed: 03/21/2020 Elsevier Patient Education  2021 [place].  Fatigue If you have fatigue, you feel tired all the time and have a lack of energy or a lack of motivation. Fatigue may make it difficult to start or complete tasks because of exhaustion. In general, occasional or mild fatigue is often a normal response to activity or life. However, long-lasting (chronic) or extreme fatigue may be a symptom of a medical condition. Follow these instructions at home: General instructions  Watch your fatigue for any changes.  Go to bed and get up at the same time every day.  Avoid fatigue by pacing yourself during the day and getting enough sleep at night.  Maintain a healthy weight. Medicines  Take over-the-counter and prescription medicines only as told by your health care provider.  Take a multivitamin, if told by your health care provider.  Do not use herbal or dietary supplements unless they are approved by your health care provider. Activity  Exercise regularly, as told by your health care provider.  Use or practice techniques to help you relax, such as yoga, tai chi, meditation, or massage therapy.   Eating and drinking  Avoid heavy meals in the evening.  Eat a well-balanced diet, which includes lean proteins, whole grains, plenty of fruits and vegetables, and low-fat dairy products.  Avoid consuming too much caffeine.  Avoid the use of alcohol.  Drink enough fluid to keep your urine pale yellow.   Lifestyle  Change situations that cause you stress. Try to keep  your work and personal schedule in balance.  Do not use any products that contain nicotine or tobacco, such as cigarettes and e-cigarettes. If you need  help quitting, ask your health care provider.  Do not use drugs. Contact a health care provider if:  Your fatigue does not get better.  You have a fever.  You suddenly lose or gain weight.  You have headaches.  You have trouble falling asleep or sleeping through the night.  You feel angry, guilty, anxious, or sad.  You are unable to have a bowel movement (constipation).  Your skin is dry.  You have swelling in your legs or another part of your body. Get help right away if:  You feel confused.  Your vision is blurry.  You feel faint or you pass out.  You have a severe headache.  You have severe pain in your abdomen, your back, or the area between your waist and hips (pelvis).  You have chest pain, shortness of breath, or an irregular or fast heartbeat.  You are unable to urinate, or you urinate less than normal.  You have abnormal bleeding, such as bleeding from the rectum, vagina, nose, lungs, or nipples.  You vomit blood.  You have thoughts about hurting yourself or others. If you ever feel like you may hurt yourself or others, or have thoughts about taking your own life, get help right away. You can go to your nearest emergency department or call:  Your local emergency services (911 in the [place]  A suicide crisis helpline, such as the [place] at (705)745-1226. This is open 24 hours a day. Summary  If you have fatigue, you feel tired all the time and have a lack of energy or a lack of motivation.  Fatigue may make it difficult to start or complete tasks because of exhaustion.  Long-lasting (chronic) or extreme fatigue may be a symptom of a medical condition.  Exercise regularly, as told by your health care provider.  Change situations that cause you stress. Try to keep your  work and personal schedule in balance. This information is not intended to replace advice given to you by your health care provider. Make sure you discuss any questions you have with your health care provider. Document Revised: 02/23/2019 Document Reviewed: 04/29/2017 Elsevier Patient Education  2021 [place].  Vaginal Yeast Infection, Adult  Vaginal yeast infection is a condition that causes vaginal discharge as well as soreness, swelling, and redness (inflammation) of the vagina. This is a common condition. Some women get this infection frequently. What are the causes? This condition is caused by a change in the normal balance of the yeast (candida) and bacteria that live in the vagina. This change causes an overgrowth of yeast, which causes the inflammation. What increases the risk? The condition is more likely to develop in women who:  Take antibiotic medicines.  Have diabetes.  Take birth control pills.  Are pregnant.  Douche often.  Have a weak body defense system (immune system).  Have been taking steroid medicines for a long time.  Frequently wear tight clothing. What are the signs or symptoms? Symptoms of this condition include:  White, thick, creamy vaginal discharge.  Swelling, itching, redness, and irritation of the vagina. The lips of the vagina (vulva) may be affected as well.  Pain or a burning feeling while urinating.  Pain during sex. How is this diagnosed? This condition is diagnosed based on:  Your medical history.  A physical exam.  A pelvic exam. Your health care provider will examine a sample of your vaginal discharge under a microscope. Your health care provider may send this sample for  testing to confirm the diagnosis. How is this treated? This condition is treated with medicine. Medicines may be over-the-counter or prescription. You may be told to use one or more of the following:  Medicine that is taken by mouth (orally).  Medicine  that is applied as a cream (topically).  Medicine that is inserted directly into the vagina (suppository). Follow these instructions at home: Lifestyle  Do not have sex until your health care provider approves. Tell your sex partner that you have a yeast infection. That person should go to his or her health care provider and ask if they should also be treated.  Do not wear tight clothes, such as pantyhose or tight pants.  Wear breathable cotton underwear. General instructions  Take or apply over-the-counter and prescription medicines only as told by your health care provider.  Eat more yogurt. This may help to keep your yeast infection from returning.  Do not use tampons until your health care provider approves.  Try taking a sitz bath to help with discomfort. This is a warm water bath that is taken while you are sitting down. The water should only come up to your hips and should cover your buttocks. Do this 3-4 times per day or as told by your health care provider.  Do not douche.  If you have diabetes, keep your blood sugar levels under control.  Keep all follow-up visits as told by your health care provider. This is important.   Contact a health care provider if:  You have a fever.  Your symptoms go away and then return.  Your symptoms do not get better with treatment.  Your symptoms get worse.  You have new symptoms.  You develop blisters in or around your vagina.  You have blood coming from your vagina and it is not your menstrual period.  You develop pain in your abdomen. Summary  Vaginal yeast infection is a condition that causes discharge as well as soreness, swelling, and redness (inflammation) of the vagina.  This condition is treated with medicine. Medicines may be over-the-counter or prescription.  Take or apply over-the-counter and prescription medicines only as told by your health care provider.  Do not douche. Do not have sex or use tampons until your  health care provider approves.  Contact a health care provider if your symptoms do not get better with treatment or your symptoms go away and then return. This information is not intended to replace advice given to you by your health care provider. Make sure you discuss any questions you have with your health care provider. Document Revised: 03/04/2019 Document Reviewed: 12/21/2017 Elsevier Patient Education  [place].

## 2020-09-26 NOTE — Progress Notes (Signed)
Chief Complaint  Patient presents with  . Hormones    F/u  1. Pain before and around time of cycle and recurrent yeast infections x 2 1 week leading up to cycle appt with Dr. Benjie Karvonen today pelvic PT did not help and c/o cystic acne using benzyol peroxide otc and helping some. C/o fatigue taking supplements B12, folate, d3 5000 IU otc and fatigue though increased exercise but has stressful job  +fibroids rec w/u for endometriosis  Trying to conceive off ocp x 5 years  Review of Systems  Constitutional: Negative for weight loss.  HENT: Negative for hearing loss.   Eyes: Negative for blurred vision.  Respiratory: Negative for shortness of breath.   Cardiovascular: Negative for chest pain.  Gastrointestinal: Negative for abdominal pain.  Genitourinary:       +painful cycles  Musculoskeletal: Negative for falls and joint pain.  Skin: Negative for rash.  Psychiatric/Behavioral: Negative for depression.       +stress    Past Medical History:  Diagnosis Date  . Blood in stool   . Constipation    Past Surgical History:  Procedure Laterality Date  . WISDOM TOOTH EXTRACTION     Family History  Problem Relation Age of Onset  . Stroke Maternal Grandfather   . Diabetes Mother        prediabetes  . Diabetes Father        prediabetes   Social History   Socioeconomic History  . Marital status: Married    Spouse name: Not on file  . Number of children: Not on file  . Years of education: Not on file  . Highest education level: Not on file  Occupational History  . Not on file  Tobacco Use  . Smoking status: Never Smoker  . Smokeless tobacco: Never Used  Substance and Sexual Activity  . Alcohol use: No    Alcohol/week: 0.0 standard drinks  . Drug use: No  . Sexual activity: Yes    Partners: Male    Birth control/protection: None  Other Topics Concern  . 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, sew, craft, photos    Social Determinants of Health   Financial Resource Strain: Not on [employer] Insecurity: Not on file  Transportation Needs: Not on file  Physical Activity: Not on file  Stress: Not on file  Social Connections: Not on file  Intimate Partner Violence: Not on file   Current Meds  Medication Sig  . Cyanocobalamin (B-12 PO) Take by mouth.  . Folic Acid (FOLATE PO) Take by mouth.   Allergies  Allergen Reactions  . Flexeril [Cyclobenzaprine]     Abnormal dreams/felt weird    Recent Results (from the past 2160 hour(s))  POCT urinalysis dipstick     Status: Abnormal   Collection Time: 09/19/20 12:13 PM  Result Value Ref Range   Color, UA yellow yellow   Clarity, UA clear clear   Glucose, UA negative negative mg/dL   Bilirubin, UA negative negative   Ketones, POC UA trace (5) (A) negative mg/dL   Spec Grav, UA >=1.030 (A) 1.010 - 1.025   Blood, UA trace-intact (A) negative   pH, UA 6.5 5.0 -  8.0   Protein Ur, POC negative negative mg/dL   Urobilinogen, UA 0.2 0.2 or 1.0 E.U./dL   Nitrite, UA Negative Negative   Leukocytes, UA Trace (A) Negative  POCT urine pregnancy     Status: None   Collection Time: 09/19/20 12:18 PM  Result Value Ref Range   Preg Test, Ur Negative Negative  Urine Culture     Status: Abnormal   Collection Time: 09/19/20 12:18 PM   Specimen: Urine, Random  Result Value Ref Range   Specimen Description URINE, RANDOM    Special Requests NONE    Culture (A)     <10,000 COLONIES/mL INSIGNIFICANT GROWTH Performed at [facility], [address] [place], [place] [address]    Report Status 09/20/2020 FINAL    Objective  Body mass index is 20.85 kg/m. Wt Readings from Last 3 Encounters:  09/26/20 114 lb (51.7 kg)  12/07/19 125 lb (56.7 kg)  11/08/19 125 lb 6.4 oz (56.9 kg)   Temp Readings from Last 3  Encounters:  09/26/20 98.3 F (36.8 C) (Oral)  09/19/20 99 F (37.2 C) (Oral)  05/12/20 99.3 F (37.4 C) (Oral)   BP Readings from Last 3 Encounters:  09/26/20 116/80  09/19/20 103/73  05/12/20 113/74   Pulse Readings from Last 3 Encounters:  09/26/20 93  09/19/20 86  05/12/20 83    Physical Exam Vitals and nursing note reviewed.  Constitutional:      Appearance: Normal appearance. She is well-developed and well-groomed.  HENT:     Head: Normocephalic and atraumatic.  Eyes:     Conjunctiva/sclera: Conjunctivae normal.     Pupils: Pupils are equal, round, and reactive to light.  Cardiovascular:     Rate and Rhythm: Normal rate and regular rhythm.     Heart sounds: Normal heart sounds. No murmur heard.   Pulmonary:     Effort: Pulmonary effort is normal.     Breath sounds: Normal breath sounds.  Abdominal:     Tenderness: There is no abdominal tenderness.  Skin:    General: Skin is warm and dry.  Neurological:     General: No focal deficit present.     Mental Status: She is alert and oriented to person, place, and time. Mental status is at baseline.     Gait: Gait normal.  Psychiatric:        Attention and Perception: Attention and perception normal.        Mood and Affect: Mood and affect normal.        Speech: Speech normal.        Behavior: Behavior normal. Behavior is cooperative.        Thought Content: Thought content normal.        Cognition and Memory: Cognition and memory normal.        Judgment: Judgment normal.     Assessment  Plan  Acne vulgaris  Fatigue, unspecified type - Plan: CBC with Differential/Platelet, Comprehensive metabolic panel, TSH,   T15 deficiency - Plan: Vitamin B1  Menses painful - Plan: Cortisol, Estradiol, FSH, LH, Testosterone  Irregular menses - Plan: Cortisol, Estradiol, FSH, LH, Testosterone  Vitamin D deficiency - Plan: Vitamin D (25 hydroxy)  HM Flu shot declines  Tdap 05/04/17  Pfizer 2/2 HPV declines    Pap 02/26/18 neg neg HPV Dr. Terance Hart mammo age 35 y.o ob/gyn Dr. Terri Piedra. Briant Sites ob/gyn rec healthy diet and exercise  Provider: Dr. Olivia Mackie McLean-Scocuzza-[facility]

## 2020-10-01 DIAGNOSIS — Z319 Encounter for procreative management, unspecified: Secondary | ICD-10-CM | POA: Diagnosis not present

## 2020-10-01 DIAGNOSIS — N946 Dysmenorrhea, unspecified: Secondary | ICD-10-CM | POA: Diagnosis not present

## 2020-10-01 DIAGNOSIS — N941 Unspecified dyspareunia: Secondary | ICD-10-CM | POA: Diagnosis not present

## 2020-11-08 ENCOUNTER — Other Ambulatory Visit: Payer: BC Managed Care – PPO

## 2020-11-28 ENCOUNTER — Ambulatory Visit
Admission: RE | Admit: 2020-11-28 | Discharge: 2020-11-28 | Disposition: A | Discharge status: Home / Self Care | Payer: BC Managed Care – PPO | Source: Ambulatory Visit | Attending: Emergency Medicine | Admitting: Emergency Medicine

## 2020-11-28 ENCOUNTER — Other Ambulatory Visit: Payer: Self-pay

## 2020-11-28 VITALS — BP 105/69 | HR 99 | Temp 99.1°F | Resp 18

## 2020-11-28 DIAGNOSIS — J029 Acute pharyngitis, unspecified: Secondary | ICD-10-CM | POA: Insufficient documentation

## 2020-11-28 LAB — POCT RAPID STREP A (OFFICE): Rapid Strep A Screen: NEGATIVE

## 2020-11-28 NOTE — Discharge Instructions (Signed)
Your rapid strep test is negative.  A throat culture is pending; we will call you if it is positive requiring treatment.    Follow up with your primary care provider if your symptoms are not improving.    

## 2020-11-28 NOTE — ED Triage Notes (Signed)
Patient presents to Urgent Care with complaints of sore throat and post nasal trip since yesterday. Treating with ibuprofen and sinus meds.  She states she has a hx of allergies and wants to rule out strep.   Denies fever or any other symptoms.

## 2020-11-28 NOTE — ED Provider Notes (Signed)
Roderic Palau    CSN: [id number] Arrival date & time: 11/28/20  6270      History   Chief Complaint Chief Complaint  Patient presents with  . Sore Throat    HPI Mariah Brennan is a 35 y.o. female.   Patient presents with 1 day history of postnasal drip and sore throat.  She denies fever, chills, rash, cough, shortness of breath, vomiting, diarrhea, or other symptoms.  Treatment attempted with ibuprofen and OTC sinus medication.  She reports medical history includes seasonal allergies.  She request strep test.  She declines COVID test.  The history is provided by the patient.    Past Medical History:  Diagnosis Date  . Blood in stool   . Constipation     Patient Active Problem List   Diagnosis Date Noted  . Fibroids 12/07/2019  . Annual physical exam 12/07/2019  . Acute vulvitis 09/07/2019  . Dyspareunia in female 09/07/2019  . Candidal vulvovaginitis 09/07/2019  . Constipation 09/07/2019  . Infertility, female 09/07/2019    Past Surgical History:  Procedure Laterality Date  . WISDOM TOOTH EXTRACTION      OB History    Gravida  0   Para  0   Term  0   Preterm  0   AB  0   Living  0     SAB  0   IAB  0   Ectopic  0   Multiple  0   Live Births               Home Medications    Prior to Admission medications   Medication Sig Start Date End Date Taking? Authorizing Provider  Cyanocobalamin (B-12 PO) Take by mouth.    [provider]  Folic Acid (FOLATE PO) Take by mouth.    [provider]    Family History Family History  Problem Relation Age of Onset  . Stroke Maternal Grandfather   . Diabetes Mother        prediabetes  . Diabetes Father        prediabetes    Social History Social History   Tobacco Use  . Smoking status: Never Smoker  . Smokeless tobacco: Never Used  Substance Use Topics  . Alcohol use: No    Alcohol/week: 0.0 standard drinks  . Drug use: No     Allergies   Flexeril  [cyclobenzaprine]   Review of Systems Review of Systems  Constitutional: Negative for chills and fever.  HENT: Positive for postnasal drip and sore throat. Negative for congestion and ear pain.   Eyes: Negative for pain and visual disturbance.  Respiratory: Negative for cough and shortness of breath.   Cardiovascular: Negative for chest pain and palpitations.  Gastrointestinal: Negative for abdominal pain, diarrhea and vomiting.  Genitourinary: Negative for dysuria and hematuria.  Musculoskeletal: Negative for arthralgias and back pain.  Skin: Negative for color change and rash.  Neurological: Negative for seizures and syncope.  All other systems reviewed and are negative.    Physical Exam Triage Vital Signs ED Triage Vitals  Enc Vitals Group     BP      Pulse      Resp      Temp      Temp src      SpO2      Weight      Height      Head Circumference      Peak Flow      Pain  Score      Pain Loc      Pain Edu?      Excl. in [place]?    No data found.  Updated Vital Signs BP 105/69 (BP Location: Left Arm)   Pulse 99   Temp 99.1 F (37.3 C) (Oral)   Resp 18   LMP 11/09/2020   SpO2 98%   Visual Acuity Right Eye Distance:   Left Eye Distance:   Bilateral Distance:    Right Eye Near:   Left Eye Near:    Bilateral Near:     Physical Exam Vitals and nursing note reviewed.  Constitutional:      General: She is not in acute distress.    Appearance: She is well-developed. She is not ill-appearing.  HENT:     Head: Normocephalic and atraumatic.     Right Ear: Tympanic membrane normal.     Left Ear: Tympanic membrane normal.     Nose: Nose normal.     Mouth/Throat:     Mouth: Mucous membranes are moist.     Pharynx: Posterior oropharyngeal erythema present.     Comments: Clear postnasal drip. Eyes:     Conjunctiva/sclera: Conjunctivae normal.  Cardiovascular:     Rate and Rhythm: Normal rate and regular rhythm.     Heart sounds: Normal heart sounds.   Pulmonary:     Effort: Pulmonary effort is normal. No respiratory distress.     Breath sounds: Normal breath sounds.  Abdominal:     Palpations: Abdomen is soft.     Tenderness: There is no abdominal tenderness.  Musculoskeletal:     Cervical back: Neck supple.  Skin:    General: Skin is warm and dry.     Findings: No rash.  Neurological:     General: No focal deficit present.     Mental Status: She is alert and oriented to person, place, and time.  Psychiatric:        Mood and Affect: Mood normal.        Behavior: Behavior normal.      UC Treatments / Results  Labs (all labs ordered are listed, but only abnormal results are displayed) Labs Reviewed  CULTURE, GROUP A STREP [facility])  POCT RAPID STREP A (OFFICE)    EKG   Radiology No results found.  Procedures Procedures (including critical care time)  Medications Ordered in UC Medications - No data to display  Initial Impression / Assessment and Plan / UC Course  I have reviewed the triage vital signs and the nursing notes.  Pertinent labs & imaging results that were available during my care of the patient were reviewed by me and considered in my medical decision making (see chart for details).   Viral pharyngitis.  Rapid strep negative; culture pending.  Patient declines COVID test.  Discussed symptomatic treatment including Tylenol or ibuprofen, Mucinex or other OTC sinus medication.  Also discussed allergy medication such as Claritin or Allegra.  Instructed patient to follow-up with her PCP if her symptoms are not improving.  She agrees to plan of care.   Final Clinical Impressions(s) / UC Diagnoses   Final diagnoses:  Viral pharyngitis     Discharge Instructions     Your rapid strep test is negative.  A throat culture is pending; we will call you if it is positive requiring treatment.    Follow up with your primary care provider if your symptoms are not improving.        ED Prescriptions  None      PDMP not reviewed this encounter.   Sharion Balloon, NP 11/28/20 1054

## 2020-11-30 LAB — CULTURE, GROUP A STREP (THRC)

## 2021-01-11 ENCOUNTER — Other Ambulatory Visit: Payer: Self-pay | Admitting: Obstetrics & Gynecology

## 2021-01-25 ENCOUNTER — Other Ambulatory Visit: Payer: Self-pay

## 2021-01-25 ENCOUNTER — Encounter (HOSPITAL_BASED_OUTPATIENT_CLINIC_OR_DEPARTMENT_OTHER): Payer: Self-pay | Admitting: Obstetrics & Gynecology

## 2021-01-25 NOTE — Progress Notes (Signed)
Spoke w/ via phone for pre-op interview--- Pt Lab needs dos----  Urine preg             Lab results------ pt getting CBC, BMP, T&S done 01-28-2021 @ 0915 COVID test -----patient states asymptomatic no test needed Arrive at -------  0530 on 01-31-2021 NPO after MN NO Solid Food.  Clear liquids from MN until--- 0430 Med rec completed Medications to take morning of surgery ----- NONE Diabetic medication ----- n/a Patient instructed no nail polish to be worn day of surgery Patient instructed to bring photo id and insurance card day of surgery Patient aware to have Driver (ride ) / caregiver for 24 hours after surgery -- husband, Mariah Brennan Patient Special Instructions ----- n/a Pre-Op special Istructions ----- n/a Patient verbalized understanding of instructions that were given at this phone interview. Patient denies shortness of breath, chest pain, fever, cough at this phone interview.

## 2021-01-28 ENCOUNTER — Other Ambulatory Visit: Payer: Self-pay

## 2021-01-28 ENCOUNTER — Encounter (HOSPITAL_COMMUNITY)
Admission: RE | Admit: 2021-01-28 | Discharge: 2021-01-28 | Disposition: A | Discharge status: Home / Self Care | Payer: BC Managed Care – PPO | Source: Ambulatory Visit | Attending: Obstetrics & Gynecology | Admitting: Obstetrics & Gynecology

## 2021-01-28 DIAGNOSIS — Z885 Allergy status to narcotic agent status: Secondary | ICD-10-CM | POA: Diagnosis not present

## 2021-01-28 DIAGNOSIS — Z01812 Encounter for preprocedural laboratory examination: Secondary | ICD-10-CM | POA: Insufficient documentation

## 2021-01-28 DIAGNOSIS — Z888 Allergy status to other drugs, medicaments and biological substances status: Secondary | ICD-10-CM | POA: Diagnosis not present

## 2021-01-28 DIAGNOSIS — G8929 Other chronic pain: Secondary | ICD-10-CM | POA: Diagnosis not present

## 2021-01-28 DIAGNOSIS — K589 Irritable bowel syndrome without diarrhea: Secondary | ICD-10-CM | POA: Diagnosis not present

## 2021-01-28 DIAGNOSIS — N803 Endometriosis of pelvic peritoneum: Secondary | ICD-10-CM | POA: Diagnosis not present

## 2021-01-28 DIAGNOSIS — N946 Dysmenorrhea, unspecified: Secondary | ICD-10-CM | POA: Diagnosis not present

## 2021-01-28 DIAGNOSIS — R102 Pelvic and perineal pain: Secondary | ICD-10-CM | POA: Diagnosis not present

## 2021-01-28 LAB — CBC
HCT: 37.6 % (ref 36.0–46.0)
Hemoglobin: 12.3 g/dL (ref 12.0–15.0)
MCH: 29.6 pg (ref 26.0–34.0)
MCHC: 32.7 g/dL (ref 30.0–36.0)
MCV: 90.4 fL (ref 80.0–100.0)
Platelets: 166 10*3/uL (ref 150–400)
RBC: 4.16 MIL/uL (ref 3.87–5.11)
RDW: 13.3 % (ref 11.5–15.5)
WBC: 4 10*3/uL (ref 4.0–10.5)
nRBC: 0 % (ref 0.0–0.2)

## 2021-01-28 LAB — BASIC METABOLIC PANEL
Anion gap: 6 (ref 5–15)
BUN: 17 mg/dL (ref 6–20)
CO2: 24 mmol/L (ref 22–32)
Calcium: 9.6 mg/dL (ref 8.9–10.3)
Chloride: 108 mmol/L (ref 98–111)
Creatinine, Ser: 0.62 mg/dL (ref 0.44–1.00)
GFR, Estimated: >60 mL/min (ref >60)
Glucose, Bld: 83 mg/dL (ref 70–99)
Potassium: 4 mmol/L (ref 3.5–5.1)
Sodium: 138 mmol/L (ref 135–145)

## 2021-01-30 NOTE — Anesthesia Preprocedure Evaluation (Addendum)
Anesthesia Evaluation  Patient identified by MRN, date of birth, ID band Patient awake    Reviewed: Allergy & Precautions, NPO status , Patient's Chart, lab work & pertinent test results  History of Anesthesia Complications Negative for: history of anesthetic complications  Airway Mallampati: II  TM Distance: >3 FB Neck ROM: Full    Dental no notable dental hx. (+) Dental Advisory Given   Pulmonary neg pulmonary ROS,    Pulmonary exam normal        Cardiovascular negative cardio ROS Normal cardiovascular exam     Neuro/Psych negative neurological ROS     GI/Hepatic negative GI ROS, Neg liver ROS,   Endo/Other  negative endocrine ROS  Renal/GU negative Renal ROS     Musculoskeletal negative musculoskeletal ROS (+)   Abdominal   Peds  Hematology negative hematology ROS (+)   Anesthesia Other Findings   Reproductive/Obstetrics                            Anesthesia Physical Anesthesia Plan  ASA: 1  Anesthesia Plan: General   Post-op Pain Management:    Induction: Intravenous  PONV Risk Score and Plan: 4 or greater and Ondansetron, Dexamethasone, Midazolam and Scopolamine patch - Pre-op  Airway Management Planned: Oral ETT  Additional Equipment:   Intra-op Plan:   Post-operative Plan: Extubation in OR  Informed Consent: I have reviewed the patients History and Physical, chart, labs and discussed the procedure including the risks, benefits and alternatives for the proposed anesthesia with the patient or authorized representative who has indicated his/her understanding and acceptance.     Dental advisory given  Plan Discussed with: Anesthesiologist and CRNA  Anesthesia Plan Comments:        Anesthesia Quick Evaluation

## 2021-01-30 NOTE — H&P (Addendum)
Mariah Brennan is an 35 y.o. female with chronic pelvic pain. She is here for da vinci assisted operative laparoscopy. She has IBS, dysmenorrhea and pelvic pain with suspicion of endometriosis.   Regular painful menses, dysmenorrhea and dyspareunia that gets worse before menses. Nl Paps, HPV neg. No breast issues  Office pelvic sono - 3 small <1 cm myomas, nl endometrium and ovaries. No free or collected pelvic fluid  Suspect endometriosis. desires conception, so we cant offer IUD or Orilissa or OCs.    Patient's last menstrual period was 01/02/2021 (exact date).    Past Medical History:  Diagnosis Date   Chronic constipation    Dysmenorrhea    Wears glasses     Past Surgical History:  Procedure Laterality Date   NO PAST SURGERIES      Family History  Problem Relation Age of Onset   Stroke Maternal Grandfather    Diabetes Mother        prediabetes   Diabetes Father        prediabetes    Social History:  reports that she has never smoked. She has never used smokeless tobacco. She reports that she does not drink alcohol and does not use drugs.  Allergies:  Allergies  Allergen Reactions   Flexeril [Cyclobenzaprine]     Abnormal dreams/felt weird    Hydrocodone Nausea And Vomiting   Lactose Intolerance (Gi)    Tramadol Nausea And Vomiting    No medications prior to admission.    Review of Systems neg  Height 5\' 2"  (1.575 m), weight 53.5 kg, last menstrual period 01/02/2021. Physical Exam  A&O x 3, no acute distress. Pleasant HEENT neg, no thyromegaly Lungs CTA bilat CV RRR, S1S2 normal Abdo soft, non tender, non acute Extr no edema/ tenderness Pelvic Cervix normal, vaginal normal, no CMT. Uterus retroverted and adnexa with no masses   No results found for this or any previous visit (from the past 24 hour(s)).  No results found.  Assessment/Plan: 35 yo female, G0, here for operative laparoscopy for chronic pelvic pain, dyspareunia and worsening dysmenorrhea.  I plan to use da-vinci robot for operative laparoscopy as needed. I plan to perform chromopertubation as patient desires fertility Risks/complications of surgery reviewed incl infection, bleeding, damage to internal organs including bladder, bowels, ureters, blood vessels, other risks from anesthesia, VTE and delayed complications of any surgery, complications in future surgery reviewed.   Mariah Brennan 01/30/2021, 11:23 AM  H&P undated- no new changes and plan reviewed, start with diagnostic laparoscopy and if note any pathology, proceed with Orlovista assisted operative laparoscopy. Pt voiced understanding and consents   ---V.Mariah Cushman MD

## 2021-01-31 ENCOUNTER — Encounter (HOSPITAL_BASED_OUTPATIENT_CLINIC_OR_DEPARTMENT_OTHER): Payer: Self-pay | Admitting: Obstetrics & Gynecology

## 2021-01-31 ENCOUNTER — Ambulatory Visit (HOSPITAL_BASED_OUTPATIENT_CLINIC_OR_DEPARTMENT_OTHER): Payer: BC Managed Care – PPO | Admitting: Anesthesiology

## 2021-01-31 ENCOUNTER — Other Ambulatory Visit: Payer: Self-pay

## 2021-01-31 ENCOUNTER — Encounter (HOSPITAL_BASED_OUTPATIENT_CLINIC_OR_DEPARTMENT_OTHER)
Admission: RE | Disposition: A | Discharge status: Home / Self Care | Payer: Self-pay | Source: Ambulatory Visit | Attending: Obstetrics & Gynecology

## 2021-01-31 ENCOUNTER — Ambulatory Visit (HOSPITAL_BASED_OUTPATIENT_CLINIC_OR_DEPARTMENT_OTHER)
Admission: RE | Admit: 2021-01-31 | Discharge: 2021-01-31 | Disposition: A | Discharge status: Home / Self Care | Payer: BC Managed Care – PPO | Source: Ambulatory Visit | Attending: Obstetrics & Gynecology | Admitting: Obstetrics & Gynecology

## 2021-01-31 DIAGNOSIS — Z888 Allergy status to other drugs, medicaments and biological substances status: Secondary | ICD-10-CM | POA: Diagnosis not present

## 2021-01-31 DIAGNOSIS — N809 Endometriosis, unspecified: Secondary | ICD-10-CM | POA: Diagnosis present

## 2021-01-31 DIAGNOSIS — N803 Endometriosis of pelvic peritoneum: Secondary | ICD-10-CM | POA: Insufficient documentation

## 2021-01-31 DIAGNOSIS — G8929 Other chronic pain: Secondary | ICD-10-CM | POA: Diagnosis not present

## 2021-01-31 DIAGNOSIS — K589 Irritable bowel syndrome without diarrhea: Secondary | ICD-10-CM | POA: Diagnosis not present

## 2021-01-31 DIAGNOSIS — B373 Candidiasis of vulva and vagina: Secondary | ICD-10-CM | POA: Diagnosis not present

## 2021-01-31 DIAGNOSIS — Z885 Allergy status to narcotic agent status: Secondary | ICD-10-CM | POA: Insufficient documentation

## 2021-01-31 DIAGNOSIS — N971 Female infertility of tubal origin: Secondary | ICD-10-CM | POA: Diagnosis not present

## 2021-01-31 DIAGNOSIS — N736 Female pelvic peritoneal adhesions (postinfective): Secondary | ICD-10-CM | POA: Diagnosis not present

## 2021-01-31 DIAGNOSIS — R102 Pelvic and perineal pain: Secondary | ICD-10-CM | POA: Diagnosis not present

## 2021-01-31 DIAGNOSIS — D219 Benign neoplasm of connective and other soft tissue, unspecified: Secondary | ICD-10-CM | POA: Diagnosis not present

## 2021-01-31 DIAGNOSIS — N946 Dysmenorrhea, unspecified: Secondary | ICD-10-CM | POA: Diagnosis not present

## 2021-01-31 HISTORY — PX: LAPAROSCOPY: SHX197

## 2021-01-31 HISTORY — DX: Endometriosis, unspecified: N80.9

## 2021-01-31 HISTORY — DX: Presence of spectacles and contact lenses: Z97.3

## 2021-01-31 HISTORY — DX: Dysmenorrhea, unspecified: N94.6

## 2021-01-31 HISTORY — PX: CHROMOPERTUBATION: SHX6288

## 2021-01-31 HISTORY — DX: Other constipation: K59.09

## 2021-01-31 LAB — TYPE AND SCREEN
ABO/RH(D): O NEG
Antibody Screen: NEGATIVE

## 2021-01-31 LAB — ABO/RH: ABO/RH(D): O NEG

## 2021-01-31 LAB — POCT PREGNANCY, URINE: Preg Test, Ur: NEGATIVE

## 2021-01-31 SURGERY — CHROMOPERTUBATION, FALLOPIAN TUBE
Anesthesia: General | Site: Abdomen

## 2021-01-31 MED ORDER — SCOPOLAMINE 1 MG/3DAYS TD PT72
1.0000 | MEDICATED_PATCH | TRANSDERMAL | Status: DC
  Administered 2021-01-31: 1.5 mg via TRANSDERMAL

## 2021-01-31 MED ORDER — ONDANSETRON HCL 4 MG/2ML IJ SOLN
INTRAMUSCULAR | Status: AC
  Filled 2021-01-31: qty 2

## 2021-01-31 MED ORDER — AMISULPRIDE (ANTIEMETIC) 5 MG/2ML IV SOLN
INTRAVENOUS | Status: AC
  Filled 2021-01-31: qty 4

## 2021-01-31 MED ORDER — SCOPOLAMINE 1 MG/3DAYS TD PT72
MEDICATED_PATCH | TRANSDERMAL | Status: AC
  Filled 2021-01-31: qty 1

## 2021-01-31 MED ORDER — FENTANYL CITRATE (PF) 100 MCG/2ML IJ SOLN
25.0000 ug | INTRAMUSCULAR | Status: DC | PRN
  Administered 2021-01-31 (×2): 50 ug via INTRAVENOUS

## 2021-01-31 MED ORDER — LIDOCAINE HCL (PF) 2 % IJ SOLN
INTRAMUSCULAR | Status: AC
  Filled 2021-01-31: qty 5

## 2021-01-31 MED ORDER — BUPIVACAINE HCL (PF) 0.25 % IJ SOLN
INTRAMUSCULAR | Status: DC | PRN
  Administered 2021-01-31: 17 mL

## 2021-01-31 MED ORDER — 0.9 % SODIUM CHLORIDE (POUR BTL) OPTIME
TOPICAL | Status: DC | PRN
  Administered 2021-01-31: 1000 mL

## 2021-01-31 MED ORDER — ACETAMINOPHEN 500 MG PO TABS
1000.0000 mg | ORAL_TABLET | Freq: Once | ORAL | Status: AC
  Administered 2021-01-31: 1000 mg via ORAL

## 2021-01-31 MED ORDER — MIDAZOLAM HCL 2 MG/2ML IJ SOLN
INTRAMUSCULAR | Status: AC
  Filled 2021-01-31: qty 2

## 2021-01-31 MED ORDER — ROCURONIUM BROMIDE 10 MG/ML (PF) SYRINGE
PREFILLED_SYRINGE | INTRAVENOUS | Status: AC
  Filled 2021-01-31: qty 10

## 2021-01-31 MED ORDER — ACETAMINOPHEN 500 MG PO TABS
ORAL_TABLET | ORAL | Status: AC
  Filled 2021-01-31: qty 2

## 2021-01-31 MED ORDER — DEXAMETHASONE SODIUM PHOSPHATE 4 MG/ML IJ SOLN: INTRAMUSCULAR | Status: DC | PRN

## 2021-01-31 MED ORDER — POVIDONE-IODINE 10 % EX SWAB: 2.0000 "application " | Freq: Once | CUTANEOUS | Status: DC

## 2021-01-31 MED ORDER — SODIUM CHLORIDE 0.9 % IR SOLN
Status: DC | PRN
  Administered 2021-01-31: 3000 mL

## 2021-01-31 MED ORDER — AMISULPRIDE (ANTIEMETIC) 5 MG/2ML IV SOLN
10.0000 mg | Freq: Once | INTRAVENOUS | Status: AC | PRN
Start: 2021-01-31 — End: 2021-01-31
  Administered 2021-01-31: 10 mg via INTRAVENOUS

## 2021-01-31 MED ORDER — ONDANSETRON HCL 4 MG/2ML IJ SOLN
INTRAMUSCULAR | Status: DC | PRN
  Administered 2021-01-31: 4 mg via INTRAVENOUS

## 2021-01-31 MED ORDER — LIDOCAINE HCL (CARDIAC) PF 100 MG/5ML IV SOSY
PREFILLED_SYRINGE | INTRAVENOUS | Status: DC | PRN
  Administered 2021-01-31: 80 mg via INTRAVENOUS

## 2021-01-31 MED ORDER — PROMETHAZINE HCL 25 MG/ML IJ SOLN: 6.2500 mg | INTRAMUSCULAR | Status: DC | PRN

## 2021-01-31 MED ORDER — LACTATED RINGERS IV SOLN
INTRAVENOUS | Status: DC
  Administered 2021-01-31: 1000 mL via INTRAVENOUS

## 2021-01-31 MED ORDER — SUGAMMADEX SODIUM 200 MG/2ML IV SOLN
INTRAVENOUS | Status: DC | PRN
  Administered 2021-01-31: 200 mg via INTRAVENOUS

## 2021-01-31 MED ORDER — ACETAMINOPHEN 325 MG PO TABS: 650.0000 mg | ORAL_TABLET | Freq: Four times a day (QID) | ORAL | Status: AC | PRN

## 2021-01-31 MED ORDER — PROPOFOL 10 MG/ML IV BOLUS
INTRAVENOUS | Status: DC | PRN
  Administered 2021-01-31: 150 mg via INTRAVENOUS
  Administered 2021-01-31: 50 mg via INTRAVENOUS

## 2021-01-31 MED ORDER — FENTANYL CITRATE (PF) 100 MCG/2ML IJ SOLN
INTRAMUSCULAR | Status: AC
  Filled 2021-01-31: qty 2

## 2021-01-31 MED ORDER — CELECOXIB 200 MG PO CAPS
200.0000 mg | ORAL_CAPSULE | Freq: Once | ORAL | Status: AC
  Administered 2021-01-31: 200 mg via ORAL

## 2021-01-31 MED ORDER — FENTANYL CITRATE (PF) 100 MCG/2ML IJ SOLN
INTRAMUSCULAR | Status: DC | PRN
  Administered 2021-01-31: 50 ug via INTRAVENOUS
  Administered 2021-01-31: 25 ug via INTRAVENOUS
  Administered 2021-01-31: 75 ug via INTRAVENOUS
  Administered 2021-01-31 (×2): 25 ug via INTRAVENOUS

## 2021-01-31 MED ORDER — PROPOFOL 10 MG/ML IV BOLUS
INTRAVENOUS | Status: AC
  Filled 2021-01-31: qty 40

## 2021-01-31 MED ORDER — CELECOXIB 200 MG PO CAPS
ORAL_CAPSULE | ORAL | Status: AC
  Filled 2021-01-31: qty 1

## 2021-01-31 MED ORDER — DEXAMETHASONE SODIUM PHOSPHATE 10 MG/ML IJ SOLN
INTRAMUSCULAR | Status: AC
  Filled 2021-01-31: qty 1

## 2021-01-31 MED ORDER — METHYLENE BLUE 0.5 % INJ SOLN
INTRAVENOUS | Status: DC | PRN
  Administered 2021-01-31: 1 mL

## 2021-01-31 MED ORDER — IBUPROFEN 200 MG PO TABS: 600.0000 mg | ORAL_TABLET | Freq: Four times a day (QID) | ORAL | 0 refills | Status: AC | PRN

## 2021-01-31 MED ORDER — MIDAZOLAM HCL 5 MG/5ML IJ SOLN
INTRAMUSCULAR | Status: DC | PRN
  Administered 2021-01-31: 2 mg via INTRAVENOUS

## 2021-01-31 MED ORDER — ROCURONIUM BROMIDE 100 MG/10ML IV SOLN
INTRAVENOUS | Status: DC | PRN
  Administered 2021-01-31: 60 mg via INTRAVENOUS
  Administered 2021-01-31: 20 mg via INTRAVENOUS

## 2021-01-31 MED ORDER — DEXAMETHASONE SODIUM PHOSPHATE 4 MG/ML IJ SOLN
INTRAMUSCULAR | Status: DC | PRN
  Administered 2021-01-31: 10 mg via INTRAVENOUS

## 2021-01-31 MED ORDER — SIMETHICONE 80 MG PO CHEW: 80.0000 mg | CHEWABLE_TABLET | Freq: Four times a day (QID) | ORAL | 0 refills | Status: AC | PRN

## 2021-01-31 SURGICAL SUPPLY — 86 items
ADH SKN CLS APL DERMABOND .7 (GAUZE/BANDAGES/DRESSINGS) ×3
APL SRG 38 LTWT LNG FL B (MISCELLANEOUS)
APPLICATOR ARISTA FLEXITIP XL (MISCELLANEOUS) IMPLANT
BAG SPEC RTRVL LRG 6X4 10 (ENDOMECHANICALS)
BARRIER ADHS 3X4 INTERCEED (GAUZE/BANDAGES/DRESSINGS) IMPLANT
BLADE 11 SAFETY STRL DISP (BLADE) ×4 IMPLANT
BRR ADH 4X3 ABS CNTRL BYND (GAUZE/BANDAGES/DRESSINGS)
CABLE HIGH FREQUENCY MONO STRZ (ELECTRODE) IMPLANT
CATH FOLEY 3WAY  5CC 16FR (CATHETERS)
CATH FOLEY 3WAY 5CC 16FR (CATHETERS) IMPLANT
COVER BACK TABLE 60X90IN (DRAPES) ×4 IMPLANT
COVER MAYO STAND STRL (DRAPES) ×4 IMPLANT
COVER TIP SHEARS 8 DVNC (MISCELLANEOUS) IMPLANT
COVER TIP SHEARS 8MM DA VINCI (MISCELLANEOUS)
COVER WAND RF STERILE (DRAPES) ×4 IMPLANT
DECANTER SPIKE VIAL GLASS SM (MISCELLANEOUS) ×8 IMPLANT
DEFOGGER SCOPE WARMER CLEARIFY (MISCELLANEOUS) ×4 IMPLANT
DERMABOND ADVANCED (GAUZE/BANDAGES/DRESSINGS) ×1
DERMABOND ADVANCED .7 DNX12 (GAUZE/BANDAGES/DRESSINGS) ×3 IMPLANT
DRAPE ARM DVNC X/XI (DISPOSABLE) IMPLANT
DRAPE COLUMN DVNC XI (DISPOSABLE) IMPLANT
DRAPE DA VINCI XI ARM (DISPOSABLE)
DRAPE DA VINCI XI COLUMN (DISPOSABLE)
DRAPE UTILITY XL STRL (DRAPES) ×4 IMPLANT
DRSG OPSITE POSTOP 3X4 (GAUZE/BANDAGES/DRESSINGS) IMPLANT
DURAPREP 26ML APPLICATOR (WOUND CARE) ×4 IMPLANT
ELECT REM PT RETURN 9FT ADLT (ELECTROSURGICAL) ×4
ELECTRODE REM PT RTRN 9FT ADLT (ELECTROSURGICAL) ×3 IMPLANT
GAUZE 4X4 16PLY RFD (DISPOSABLE) ×4 IMPLANT
GAUZE PETROLATUM 1 X8 (GAUZE/BANDAGES/DRESSINGS) IMPLANT
GLOVE SURG ENC MOIS LTX SZ7 (GLOVE) ×12 IMPLANT
GLOVE SURG UNDER POLY LF SZ6.5 (GLOVE) ×12 IMPLANT
GLOVE SURG UNDER POLY LF SZ7 (GLOVE) ×24 IMPLANT
GLOVE SURG UNDER POLY LF SZ7.5 (GLOVE) ×8 IMPLANT
GOWN STRL REUS W/TWL LRG LVL3 (GOWN DISPOSABLE) ×8 IMPLANT
HEMOSTAT ARISTA ABSORB 3G PWDR (HEMOSTASIS) IMPLANT
HOLDER FOLEY CATH W/STRAP (MISCELLANEOUS) IMPLANT
IRRIG SUCT STRYKERFLOW 2 WTIP (MISCELLANEOUS) ×4
IRRIGATION SUCT STRKRFLW 2 WTP (MISCELLANEOUS) ×3 IMPLANT
IV NS IRRIG 3000ML ARTHROMATIC (IV SOLUTION) ×4 IMPLANT
KIT TURNOVER CYSTO (KITS) ×4 IMPLANT
LEGGING LITHOTOMY PAIR STRL (DRAPES) ×4 IMPLANT
LIGASURE VESSEL 5MM BLUNT TIP (ELECTROSURGICAL) IMPLANT
MANIPULATOR UTERINE 4.5 ZUMI (MISCELLANEOUS) ×4 IMPLANT
NS IRRIG 1000ML POUR BTL (IV SOLUTION) ×4 IMPLANT
OBTURATOR OPTICAL STANDARD 8MM (TROCAR) ×1
OBTURATOR OPTICAL STND 8 DVNC (TROCAR) ×3
OBTURATOR OPTICALSTD 8 DVNC (TROCAR) ×3 IMPLANT
OCCLUDER COLPOPNEUMO (BALLOONS) ×4 IMPLANT
PACK LAPAROSCOPY BASIN (CUSTOM PROCEDURE TRAY) ×4 IMPLANT
PACK ROBOT WH (CUSTOM PROCEDURE TRAY) ×4 IMPLANT
PACK ROBOTIC GOWN (GOWN DISPOSABLE) ×4 IMPLANT
PACK TRENDGUARD 450 HYBRID PRO (MISCELLANEOUS) ×3 IMPLANT
PAD OB MATERNITY 4.3X12.25 (PERSONAL CARE ITEMS) ×4 IMPLANT
PAD PREP 24X48 CUFFED NSTRL (MISCELLANEOUS) ×4 IMPLANT
POUCH SPECIMEN RETRIEVAL 10MM (ENDOMECHANICALS) IMPLANT
PROTECTOR NERVE ULNAR (MISCELLANEOUS) ×8 IMPLANT
SCISSORS LAP 5X35 DISP (ENDOMECHANICALS) ×4 IMPLANT
SEAL CANN UNIV 5-8 DVNC XI (MISCELLANEOUS) ×9 IMPLANT
SEAL XI 5MM-8MM UNIVERSAL (MISCELLANEOUS) ×3
SEALER VESSEL DA VINCI XI (MISCELLANEOUS)
SEALER VESSEL EXT DVNC XI (MISCELLANEOUS) IMPLANT
SET IRRIG Y TYPE TUR BLADDER L (SET/KITS/TRAYS/PACK) IMPLANT
SET SUCTION IRRIG HYDROSURG (IRRIGATION / IRRIGATOR) IMPLANT
SET TRI-LUMEN FLTR TB AIRSEAL (TUBING) ×4 IMPLANT
SET TUBE SMOKE EVAC HIGH FLOW (TUBING) ×4 IMPLANT
SOLUTION ELECTROLUBE (MISCELLANEOUS) ×4 IMPLANT
SUT VIC AB 4-0 PS2 18 (SUTURE) ×8 IMPLANT
SUT VICRYL 0 UR6 27IN ABS (SUTURE) ×4 IMPLANT
SUT VLOC 180 0 9IN  GS21 (SUTURE)
SUT VLOC 180 0 9IN GS21 (SUTURE) IMPLANT
TIP RUMI ORANGE 6.7MMX12CM (TIP) IMPLANT
TIP UTERINE 5.1X6CM LAV DISP (MISCELLANEOUS) IMPLANT
TIP UTERINE 6.7X10CM GRN DISP (MISCELLANEOUS) IMPLANT
TIP UTERINE 6.7X6CM WHT DISP (MISCELLANEOUS) IMPLANT
TIP UTERINE 6.7X8CM BLUE DISP (MISCELLANEOUS) IMPLANT
TOWEL OR 17X26 10 PK STRL BLUE (TOWEL DISPOSABLE) ×4 IMPLANT
TRAY FOL W/BAG SLVR 16FR STRL (SET/KITS/TRAYS/PACK) IMPLANT
TRAY FOLEY W/BAG SLVR 14FR LF (SET/KITS/TRAYS/PACK) ×4 IMPLANT
TRAY FOLEY W/BAG SLVR 16FR LF (SET/KITS/TRAYS/PACK)
TRENDGUARD 450 HYBRID PRO PACK (MISCELLANEOUS) ×4
TROCAR BALLN 12MMX100 BLUNT (TROCAR) IMPLANT
TROCAR BLADELESS OPT 5 100 (ENDOMECHANICALS) IMPLANT
TROCAR PORT AIRSEAL 5X120 (TROCAR) ×4 IMPLANT
WARMER LAPAROSCOPE (MISCELLANEOUS) ×4 IMPLANT
WATER STERILE IRR 1000ML POUR (IV SOLUTION) IMPLANT

## 2021-01-31 NOTE — Op Note (Signed)
Preoperative diagnosis: Pelvic pain, dysmenorrhea Postoperative diagnosis: Pelvic endometriosis. Left tubal blockage  Procedure: Operative laparoscopy, lysis of adhesions, resection of pelvic endometriosis, left ureterolysis and chromopertubation Surgeon: Dr Azucena Fallen, MD Assistants: Derrell Lolling, CNM  Anesthesia Gen. Endotracheal IV fluids LR 1300 cc  EBL minimal 10 cc Urine clear (straight cath pre-op) 517 cc Complications none Disposition PACU and home Specimens none FIndings- Left cul-de sac, uterosacral and left ureteral peritoneal endometriosis. Left tube adhesion to colon. Colon adhesion to left lateral wall.                Left tube blocked, right open on chromopertubation                 Liver nodule noted, will refer to GI for f/up   Procedure Patient presents for diagnostic possible operative laparoscopy for worsening pelvic pain, dyspareunia, dysmenorrhea, suspect endometriosis.  Risk and complications of surgery including infection, bleeding, damage to internal organs, other complications including pneumonia, VTE were reviewed. Patient voiced understanding. Informed written consent was obtained and patient was brought to the operating room with IV running. Antibiotic was not indicated. Timeout was carried out. She underwent general anesthesia without difficulty and was given dorsal lithotomy position. Pelvic examination under anesthesia was normal. She was prepped and draped in standard fashion. Foley inserted. Speculum was placed anterior lip of cervix was grasped with tenaculum, uterus was sounded to 7 cm. Zumi manipulator was inserted after dilating cervix. Gown gloves were changed attention was focused on the abdomen.  8 mm curved incision made at upper edge of umbilicus after injecting 0.25% Marcaine. Incision extended to fascia and fascia grasped and incised and posterior rectus sheath grasped, incised and peritoneal entry noted. No adhesions or bleeding noted. Purse string  stitch with 0-Vicryl taken on the fascia and Robotic canula inserted and sutured anchored to it. Laparoscope inserted and pneumoperitoneum created. Trendelenburg given. Two lateral ports placed on either side of central post for aid manipulation. Then eventually a 4th port was placed on the left as decision was made to perform operative laparoscopy without daVinci robot assistance.   Findings-  Uterus noted to be normal. Anterior peritoneum didn't not have adhesions or endometriosis. Posterior cul-de-sac on the left and left utero-sacral endometriosis noted, left tube was adherent to colon fat and sigmoid was adherent to left round ligament and left lateral wall. Left ureter course noted evidence of endometriosis prior to crossing under left uterine vessel. Right side was free of disease. Both the ovaries were noted to be normal. Nodule about 3-4 cm on a stalk noted at inferior edge of right liver lobe.   Chromopertubation noted free dye flow from the right tube and left noted to be blocked. This was repeated and remained unchanged after tube was freed up from adhesions.  Now with scissors with intermittent energy use bowel adhesions were released. Left tube was freed up. Cul-de-sac endometriosis on left utero-sacral was desiccated and excised. Left ureterolysis done by incision lateral peritoneum and hydro-dissection through it to lift off peritoneum over left ureter. No cautery was used here. Hemostasis noted. Procedure was completed with satisfaction.  Pneumoperitoneum was released. All 4 ports removed under vision. Fascial incision closed with stay sutures of 0-Vicryl. The skin at both incisions approximated using 4-0 Vicryl in subcuticular fashion. Dermabond was applied. The uterine manipulator and foley removed.  Hemostasis was excellent.  All counts were correct x 2.  Patient brought out to the recovery room after extubation in stable condition. Plan is  to discharge home from recovery room. Surgical  findings were discussed with patient's husband  Followup with Dr. Benjie Karvonen in office in 2 weeks.  I performed this surgery. ---Azucena Fallen, MD

## 2021-01-31 NOTE — Anesthesia Procedure Notes (Signed)
Procedure Name: Intubation Date/Time: 01/31/2021 7:35 AM Performed by: Justice Rocher, CRNA Pre-anesthesia Checklist: Timeout performed Patient Re-evaluated:Patient Re-evaluated prior to induction Oxygen Delivery Method: Circle system utilized Preoxygenation: Pre-oxygenation with 100% oxygen Induction Type: IV induction Ventilation: Mask ventilation without difficulty Laryngoscope Size: Mac and 3 Grade View: Grade II Tube type: Oral Number of attempts: 1 Airway Equipment and Method: Stylet and Oral airway Placement Confirmation: ETT inserted through vocal cords under direct vision, positive ETCO2, breath sounds checked- equal and bilateral and CO2 detector Secured at: 23 cm Tube secured with: Tape Dental Injury: Teeth and Oropharynx as per pre-operative assessment

## 2021-01-31 NOTE — Anesthesia Postprocedure Evaluation (Signed)
Anesthesia Post Note  Patient: Mariah Brennan  Procedure(s) Performed: CHROMOPERTUBATION (Bilateral: Abdomen) LAPAROSCOPY OPERATIVE WITH LYSIS OF ADHESIONS,  LEFT URETERLYSIS, RESECTION OF ENDOMETROSIS (Abdomen)     Patient location during evaluation: PACU Anesthesia Type: General Level of consciousness: sedated Pain management: pain level controlled Vital Signs Assessment: post-procedure vital signs reviewed and stable Respiratory status: spontaneous breathing and respiratory function stable Cardiovascular status: stable Postop Assessment: no apparent nausea or vomiting Anesthetic complications: no   No notable events documented.  Last Vitals:  Vitals:   01/31/21 1000 01/31/21 1054  BP: 136/86 111/68  Pulse: 89 88  Resp: 13 15  Temp: 36.7 C 36.6 C  SpO2: 100% 99%    Last Pain:  Vitals:   01/31/21 1054  TempSrc:   PainSc: 4                  Kaladin Noseworthy DANIEL

## 2021-01-31 NOTE — Discharge Instructions (Signed)

## 2021-01-31 NOTE — Transfer of Care (Signed)
Immediate Anesthesia Transfer of Care Note  Patient: Mariah Brennan  Procedure(s) Performed: Procedure(s) (LRB): CHROMOPERTUBATION (Bilateral) LAPAROSCOPY OPERATIVE WITH LYSIS OF ADHESIONS,  LEFT URETERLYSIS, RESECTION OF ENDOMETROSIS  Patient Location: PACU  Anesthesia Type: General  Level of Consciousness: awake, sedated, patient cooperative and responds to stimulation  Airway & Oxygen Therapy: Patient Spontanous Breathing and Patient connected to [place] 02 and soft FM   Post-op Assessment: Report given to PACU RN, Post -op Vital signs reviewed and stable and Patient moving all extremities  Post vital signs: Reviewed and stable  Complications: No apparent anesthesia complications

## 2021-02-01 ENCOUNTER — Encounter (HOSPITAL_BASED_OUTPATIENT_CLINIC_OR_DEPARTMENT_OTHER): Payer: Self-pay | Admitting: Obstetrics & Gynecology

## 2021-02-13 ENCOUNTER — Telehealth: Payer: BC Managed Care – PPO | Admitting: Nurse Practitioner

## 2021-02-13 DIAGNOSIS — B373 Candidiasis of vulva and vagina: Secondary | ICD-10-CM | POA: Diagnosis not present

## 2021-02-13 DIAGNOSIS — B3731 Acute candidiasis of vulva and vagina: Secondary | ICD-10-CM

## 2021-02-13 MED ORDER — FLUCONAZOLE 150 MG PO TABS: 150.0000 mg | ORAL_TABLET | Freq: Once | ORAL | 0 refills | Status: AC

## 2021-02-13 NOTE — Progress Notes (Signed)

## 2021-02-20 DIAGNOSIS — K769 Liver disease, unspecified: Secondary | ICD-10-CM | POA: Diagnosis not present

## 2021-02-20 DIAGNOSIS — N803 Endometriosis of pelvic peritoneum: Secondary | ICD-10-CM | POA: Diagnosis not present

## 2021-04-03 ENCOUNTER — Encounter: Payer: Self-pay | Admitting: Internal Medicine

## 2021-04-03 NOTE — Telephone Encounter (Signed)
Please advise 

## 2021-05-03 DIAGNOSIS — U071 COVID-19: Secondary | ICD-10-CM | POA: Diagnosis not present

## 2021-05-03 DIAGNOSIS — R0981 Nasal congestion: Secondary | ICD-10-CM | POA: Diagnosis not present

## 2021-05-29 DIAGNOSIS — K769 Liver disease, unspecified: Secondary | ICD-10-CM | POA: Diagnosis not present

## 2021-06-06 DIAGNOSIS — R200 Anesthesia of skin: Secondary | ICD-10-CM | POA: Diagnosis not present

## 2021-06-06 DIAGNOSIS — L910 Hypertrophic scar: Secondary | ICD-10-CM | POA: Diagnosis not present

## 2021-06-06 DIAGNOSIS — R102 Pelvic and perineal pain: Secondary | ICD-10-CM | POA: Diagnosis not present

## 2021-06-13 DIAGNOSIS — K7689 Other specified diseases of liver: Secondary | ICD-10-CM | POA: Diagnosis not present

## 2021-06-13 DIAGNOSIS — D1803 Hemangioma of intra-abdominal structures: Secondary | ICD-10-CM | POA: Diagnosis not present

## 2021-06-14 DIAGNOSIS — J019 Acute sinusitis, unspecified: Secondary | ICD-10-CM | POA: Diagnosis not present

## 2021-06-14 DIAGNOSIS — J040 Acute laryngitis: Secondary | ICD-10-CM | POA: Diagnosis not present

## 2021-06-26 DIAGNOSIS — K769 Liver disease, unspecified: Secondary | ICD-10-CM | POA: Diagnosis not present

## 2021-06-26 DIAGNOSIS — N80329 Endometriosis of the posterior cul-de-sac, unspecified depth: Secondary | ICD-10-CM | POA: Diagnosis not present

## 2021-10-04 DIAGNOSIS — F4323 Adjustment disorder with mixed anxiety and depressed mood: Secondary | ICD-10-CM | POA: Diagnosis not present

## 2021-10-11 DIAGNOSIS — M2550 Pain in unspecified joint: Secondary | ICD-10-CM | POA: Diagnosis not present

## 2021-10-11 DIAGNOSIS — M152 Bouchard's nodes (with arthropathy): Secondary | ICD-10-CM | POA: Diagnosis not present

## 2021-10-18 DIAGNOSIS — F4323 Adjustment disorder with mixed anxiety and depressed mood: Secondary | ICD-10-CM | POA: Diagnosis not present

## 2021-10-21 DIAGNOSIS — J029 Acute pharyngitis, unspecified: Secondary | ICD-10-CM | POA: Diagnosis not present

## 2021-10-21 DIAGNOSIS — R768 Other specified abnormal immunological findings in serum: Secondary | ICD-10-CM | POA: Diagnosis not present

## 2021-10-24 DIAGNOSIS — J0100 Acute maxillary sinusitis, unspecified: Secondary | ICD-10-CM | POA: Diagnosis not present

## 2021-10-25 DIAGNOSIS — F4323 Adjustment disorder with mixed anxiety and depressed mood: Secondary | ICD-10-CM | POA: Diagnosis not present

## 2021-11-02 DIAGNOSIS — F4323 Adjustment disorder with mixed anxiety and depressed mood: Secondary | ICD-10-CM | POA: Diagnosis not present

## 2021-11-22 DIAGNOSIS — F4323 Adjustment disorder with mixed anxiety and depressed mood: Secondary | ICD-10-CM | POA: Diagnosis not present

## 2021-12-06 DIAGNOSIS — F4323 Adjustment disorder with mixed anxiety and depressed mood: Secondary | ICD-10-CM | POA: Diagnosis not present

## 2021-12-13 DIAGNOSIS — F4323 Adjustment disorder with mixed anxiety and depressed mood: Secondary | ICD-10-CM | POA: Diagnosis not present

## 2021-12-17 DIAGNOSIS — N946 Dysmenorrhea, unspecified: Secondary | ICD-10-CM | POA: Diagnosis not present

## 2021-12-17 DIAGNOSIS — N809 Endometriosis, unspecified: Secondary | ICD-10-CM | POA: Diagnosis not present

## 2021-12-20 DIAGNOSIS — F4323 Adjustment disorder with mixed anxiety and depressed mood: Secondary | ICD-10-CM | POA: Diagnosis not present

## 2022-01-04 DIAGNOSIS — F4323 Adjustment disorder with mixed anxiety and depressed mood: Secondary | ICD-10-CM | POA: Diagnosis not present

## 2022-01-28 DIAGNOSIS — F4323 Adjustment disorder with mixed anxiety and depressed mood: Secondary | ICD-10-CM | POA: Diagnosis not present

## 2022-02-07 DIAGNOSIS — F4323 Adjustment disorder with mixed anxiety and depressed mood: Secondary | ICD-10-CM | POA: Diagnosis not present

## 2022-02-19 IMAGING — DX DG ABDOMEN 1V
2 series · 2 of 2 positions shown · non-contrast
Comparison: None.

CLINICAL DATA: Abdominal pain

EXAM:
ABDOMEN - 1 VIEW

[abdomen standing ap (1 of 2)]
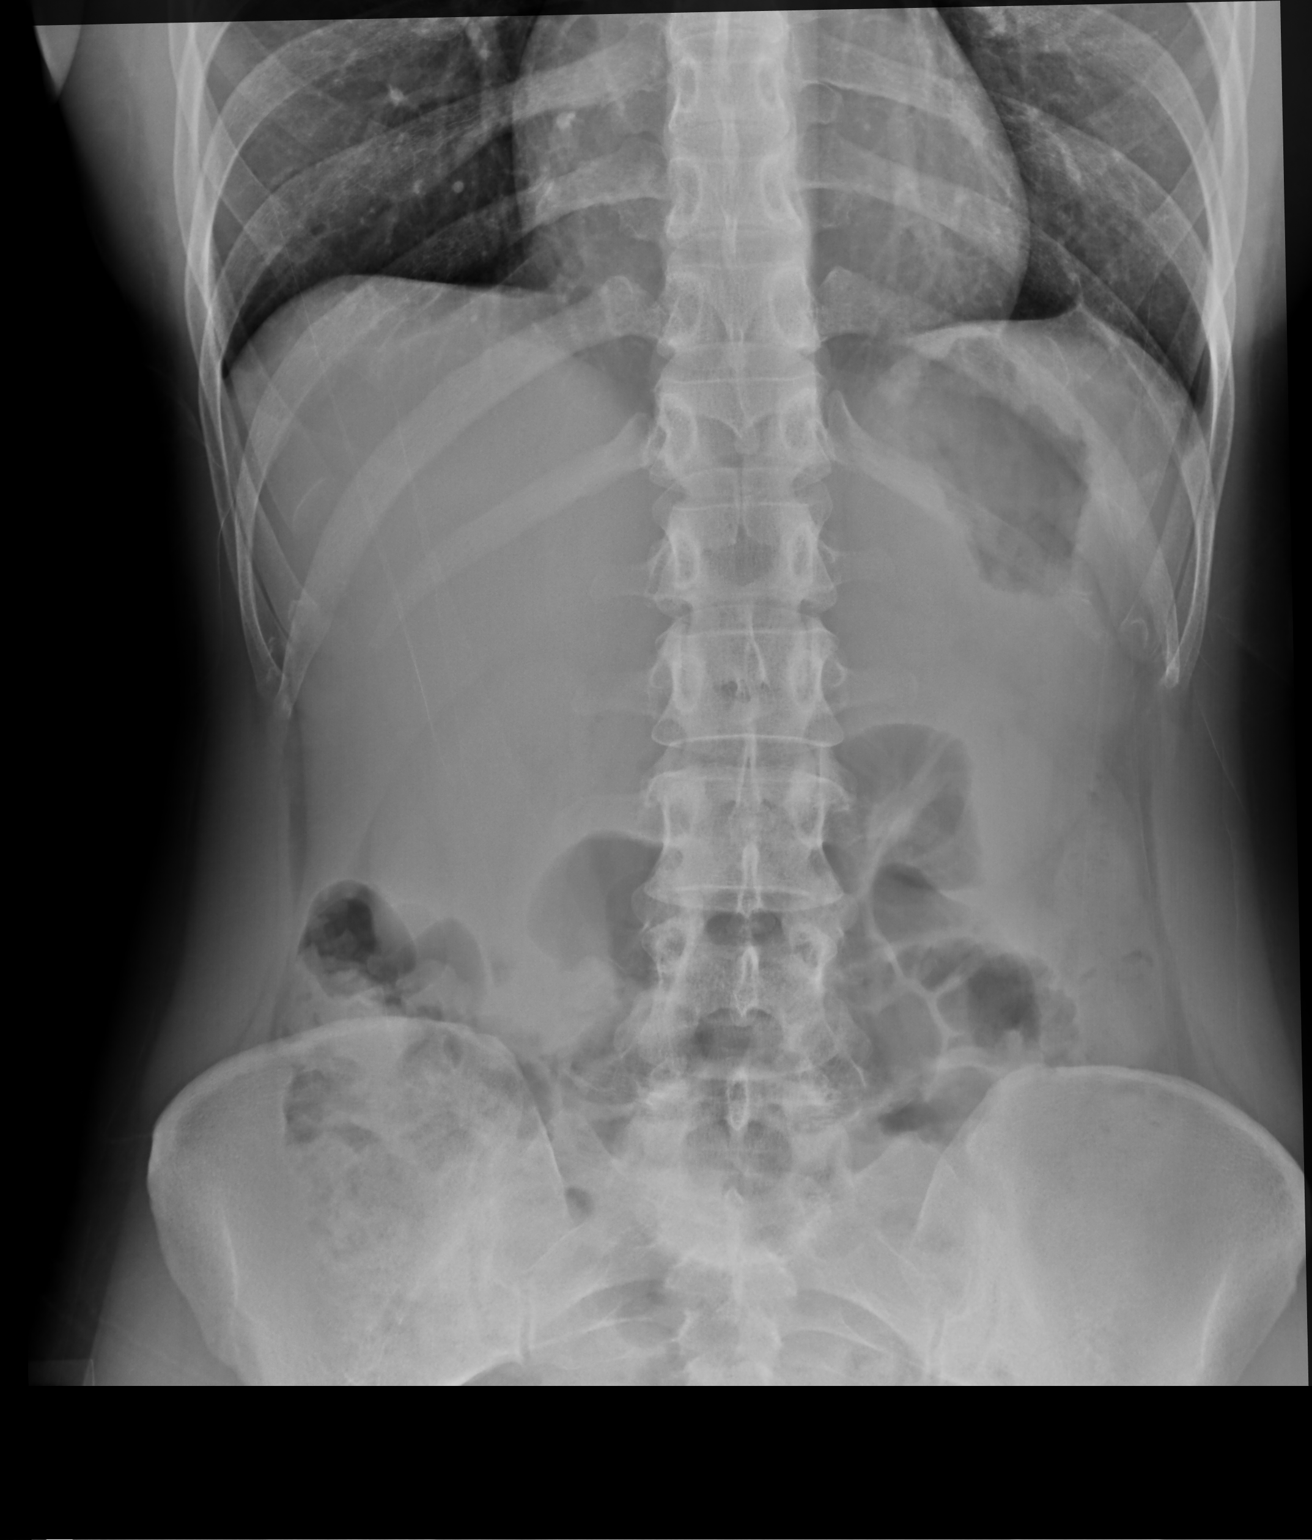

[abdomen standing ap (2 of 2)]
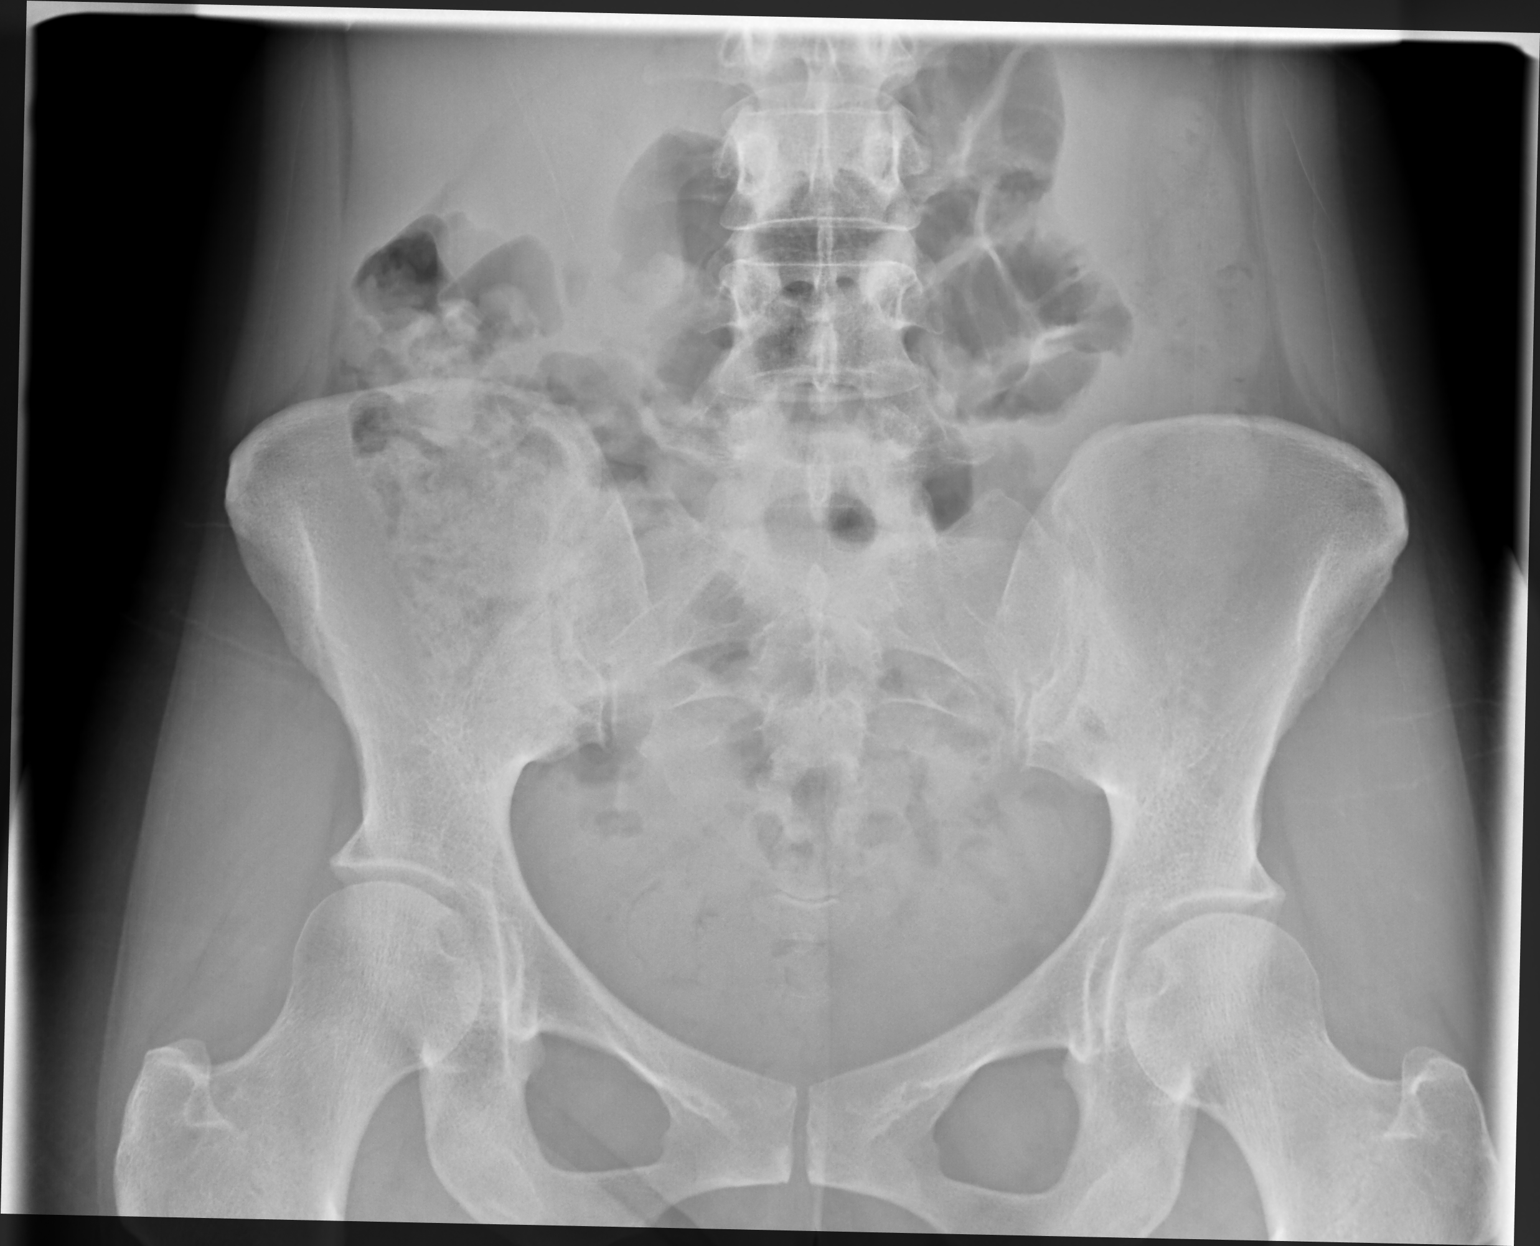

[2 of 2 positions shown; findings below may reference images not displayed]

FINDINGS: The bowel gas pattern is normal. No radio-opaque calculi or other
significant radiographic abnormality are seen.
IMPRESSION: Negative.

## 2022-02-21 DIAGNOSIS — F4323 Adjustment disorder with mixed anxiety and depressed mood: Secondary | ICD-10-CM | POA: Diagnosis not present

## 2022-02-27 DIAGNOSIS — N809 Endometriosis, unspecified: Secondary | ICD-10-CM | POA: Diagnosis not present

## 2022-03-07 DIAGNOSIS — F4323 Adjustment disorder with mixed anxiety and depressed mood: Secondary | ICD-10-CM | POA: Diagnosis not present

## 2022-03-21 DIAGNOSIS — F4323 Adjustment disorder with mixed anxiety and depressed mood: Secondary | ICD-10-CM | POA: Diagnosis not present

## 2022-04-04 DIAGNOSIS — F4323 Adjustment disorder with mixed anxiety and depressed mood: Secondary | ICD-10-CM | POA: Diagnosis not present

## 2022-04-18 DIAGNOSIS — F4323 Adjustment disorder with mixed anxiety and depressed mood: Secondary | ICD-10-CM | POA: Diagnosis not present

## 2022-05-02 DIAGNOSIS — F4323 Adjustment disorder with mixed anxiety and depressed mood: Secondary | ICD-10-CM | POA: Diagnosis not present

## 2022-05-30 DIAGNOSIS — F4323 Adjustment disorder with mixed anxiety and depressed mood: Secondary | ICD-10-CM | POA: Diagnosis not present

## 2022-06-13 DIAGNOSIS — F4323 Adjustment disorder with mixed anxiety and depressed mood: Secondary | ICD-10-CM | POA: Diagnosis not present

## 2022-06-27 DIAGNOSIS — F4323 Adjustment disorder with mixed anxiety and depressed mood: Secondary | ICD-10-CM | POA: Diagnosis not present

## 2022-07-25 DIAGNOSIS — F4323 Adjustment disorder with mixed anxiety and depressed mood: Secondary | ICD-10-CM | POA: Diagnosis not present

## 2022-08-03 DIAGNOSIS — R5381 Other malaise: Secondary | ICD-10-CM | POA: Diagnosis not present

## 2022-08-03 DIAGNOSIS — R0982 Postnasal drip: Secondary | ICD-10-CM | POA: Diagnosis not present

## 2022-08-03 DIAGNOSIS — J102 Influenza due to other identified influenza virus with gastrointestinal manifestations: Secondary | ICD-10-CM | POA: Diagnosis not present

## 2022-08-03 DIAGNOSIS — R059 Cough, unspecified: Secondary | ICD-10-CM | POA: Diagnosis not present

## 2022-08-03 DIAGNOSIS — M791 Myalgia, unspecified site: Secondary | ICD-10-CM | POA: Diagnosis not present

## 2022-08-03 DIAGNOSIS — R509 Fever, unspecified: Secondary | ICD-10-CM | POA: Diagnosis not present
